# Patient Record
Sex: Female | Born: 1960 | Race: White | Hispanic: No | Marital: Married | Smoking: Never smoker
Health system: Southern US, Community
[De-identification: ages and names within clinical notes are randomized; demographics above are authoritative.]

## PROBLEM LIST (undated history)

## (undated) DIAGNOSIS — T8859XA Other complications of anesthesia, initial encounter: Secondary | ICD-10-CM

## (undated) DIAGNOSIS — Z8489 Family history of other specified conditions: Secondary | ICD-10-CM

## (undated) DIAGNOSIS — R0600 Dyspnea, unspecified: Secondary | ICD-10-CM

## (undated) DIAGNOSIS — R011 Cardiac murmur, unspecified: Secondary | ICD-10-CM

## (undated) DIAGNOSIS — G43909 Migraine, unspecified, not intractable, without status migrainosus: Secondary | ICD-10-CM

## (undated) DIAGNOSIS — I422 Other hypertrophic cardiomyopathy: Secondary | ICD-10-CM

## (undated) DIAGNOSIS — E785 Hyperlipidemia, unspecified: Secondary | ICD-10-CM

## (undated) DIAGNOSIS — F419 Anxiety disorder, unspecified: Secondary | ICD-10-CM

## (undated) DIAGNOSIS — Z95810 Presence of automatic (implantable) cardiac defibrillator: Secondary | ICD-10-CM

## (undated) DIAGNOSIS — M1712 Unilateral primary osteoarthritis, left knee: Secondary | ICD-10-CM

## (undated) DIAGNOSIS — I4729 Other ventricular tachycardia: Secondary | ICD-10-CM

## (undated) DIAGNOSIS — I499 Cardiac arrhythmia, unspecified: Secondary | ICD-10-CM

## (undated) DIAGNOSIS — Z8719 Personal history of other diseases of the digestive system: Secondary | ICD-10-CM

## (undated) DIAGNOSIS — T4145XA Adverse effect of unspecified anesthetic, initial encounter: Secondary | ICD-10-CM

## (undated) DIAGNOSIS — I421 Obstructive hypertrophic cardiomyopathy: Secondary | ICD-10-CM

## (undated) DIAGNOSIS — K219 Gastro-esophageal reflux disease without esophagitis: Secondary | ICD-10-CM

## (undated) DIAGNOSIS — M1711 Unilateral primary osteoarthritis, right knee: Secondary | ICD-10-CM

## (undated) DIAGNOSIS — I330 Acute and subacute infective endocarditis: Secondary | ICD-10-CM

## (undated) DIAGNOSIS — Z950 Presence of cardiac pacemaker: Secondary | ICD-10-CM

## (undated) DIAGNOSIS — R9431 Abnormal electrocardiogram [ECG] [EKG]: Secondary | ICD-10-CM

## (undated) DIAGNOSIS — E119 Type 2 diabetes mellitus without complications: Secondary | ICD-10-CM

## (undated) DIAGNOSIS — I4891 Unspecified atrial fibrillation: Secondary | ICD-10-CM

## (undated) DIAGNOSIS — Z8614 Personal history of Methicillin resistant Staphylococcus aureus infection: Secondary | ICD-10-CM

## (undated) DIAGNOSIS — Z87442 Personal history of urinary calculi: Secondary | ICD-10-CM

## (undated) DIAGNOSIS — K5730 Diverticulosis of large intestine without perforation or abscess without bleeding: Secondary | ICD-10-CM

## (undated) DIAGNOSIS — I472 Ventricular tachycardia: Secondary | ICD-10-CM

## (undated) HISTORY — DX: Unilateral primary osteoarthritis, left knee: M17.12

## (undated) HISTORY — DX: Unspecified atrial fibrillation: I48.91

## (undated) HISTORY — PX: MITRAL VALVE REPAIR: SHX2039

## (undated) HISTORY — DX: Ventricular tachycardia: I47.2

## (undated) HISTORY — DX: Hyperlipidemia, unspecified: E78.5

## (undated) HISTORY — DX: Migraine, unspecified, not intractable, without status migrainosus: G43.909

## (undated) HISTORY — DX: Cardiac murmur, unspecified: R01.1

## (undated) HISTORY — PX: INSERT / REPLACE / REMOVE PACEMAKER: SUR710

## (undated) HISTORY — PX: BI-VENTRICULAR IMPLANTABLE CARDIOVERTER DEFIBRILLATOR  (CRT-D): SHX5747

## (undated) HISTORY — DX: Acute and subacute infective endocarditis: I33.0

## (undated) HISTORY — DX: Abnormal electrocardiogram (ECG) (EKG): R94.31

## (undated) HISTORY — DX: Personal history of Methicillin resistant Staphylococcus aureus infection: Z86.14

## (undated) HISTORY — DX: Diverticulosis of large intestine without perforation or abscess without bleeding: K57.30

## (undated) HISTORY — DX: Obstructive hypertrophic cardiomyopathy: I42.1

## (undated) HISTORY — DX: Other ventricular tachycardia: I47.29

---

## 1989-08-29 HISTORY — PX: SHOULDER CAPSULORRHAPHY: SUR188

## 1998-05-26 ENCOUNTER — Ambulatory Visit (HOSPITAL_COMMUNITY): Admission: RE | Admit: 1998-05-26 | Discharge: 1998-05-26 | Payer: Self-pay | Admitting: Surgery

## 2001-05-31 ENCOUNTER — Ambulatory Visit (HOSPITAL_COMMUNITY): Admission: RE | Admit: 2001-05-31 | Discharge: 2001-05-31 | Payer: Self-pay | Admitting: Obstetrics and Gynecology

## 2001-05-31 ENCOUNTER — Encounter: Payer: Self-pay | Admitting: Obstetrics and Gynecology

## 2002-06-05 ENCOUNTER — Ambulatory Visit (HOSPITAL_COMMUNITY): Admission: RE | Admit: 2002-06-05 | Discharge: 2002-06-05 | Payer: Self-pay | Admitting: Obstetrics and Gynecology

## 2002-06-05 ENCOUNTER — Encounter: Payer: Self-pay | Admitting: Obstetrics and Gynecology

## 2003-06-09 ENCOUNTER — Ambulatory Visit (HOSPITAL_COMMUNITY): Admission: RE | Admit: 2003-06-09 | Discharge: 2003-06-09 | Payer: Self-pay | Admitting: Obstetrics and Gynecology

## 2003-06-09 ENCOUNTER — Encounter: Payer: Self-pay | Admitting: Obstetrics and Gynecology

## 2004-06-28 ENCOUNTER — Ambulatory Visit (HOSPITAL_COMMUNITY): Admission: RE | Admit: 2004-06-28 | Discharge: 2004-06-28 | Payer: Self-pay | Admitting: Obstetrics and Gynecology

## 2005-09-23 ENCOUNTER — Ambulatory Visit (HOSPITAL_COMMUNITY): Admission: RE | Admit: 2005-09-23 | Discharge: 2005-09-23 | Payer: Self-pay | Admitting: Obstetrics and Gynecology

## 2005-12-28 ENCOUNTER — Encounter: Payer: Self-pay | Admitting: Surgery

## 2007-06-29 ENCOUNTER — Ambulatory Visit (HOSPITAL_COMMUNITY): Admission: RE | Admit: 2007-06-29 | Discharge: 2007-06-29 | Payer: Self-pay | Admitting: Obstetrics and Gynecology

## 2007-08-30 DIAGNOSIS — K5730 Diverticulosis of large intestine without perforation or abscess without bleeding: Secondary | ICD-10-CM

## 2007-08-30 DIAGNOSIS — Z8614 Personal history of Methicillin resistant Staphylococcus aureus infection: Secondary | ICD-10-CM

## 2007-08-30 HISTORY — DX: Personal history of Methicillin resistant Staphylococcus aureus infection: Z86.14

## 2007-08-30 HISTORY — PX: COLON SURGERY: SHX602

## 2007-08-30 HISTORY — DX: Diverticulosis of large intestine without perforation or abscess without bleeding: K57.30

## 2007-11-10 ENCOUNTER — Emergency Department (HOSPITAL_COMMUNITY): Admission: EM | Admit: 2007-11-10 | Discharge: 2007-11-10 | Payer: Self-pay | Admitting: Emergency Medicine

## 2007-11-12 ENCOUNTER — Inpatient Hospital Stay (HOSPITAL_COMMUNITY): Admission: EM | Admit: 2007-11-12 | Discharge: 2007-11-16 | Payer: Self-pay | Admitting: Emergency Medicine

## 2007-11-21 ENCOUNTER — Ambulatory Visit (HOSPITAL_COMMUNITY): Admission: RE | Admit: 2007-11-21 | Discharge: 2007-11-21 | Payer: Self-pay | Admitting: General Surgery

## 2007-11-28 ENCOUNTER — Ambulatory Visit (HOSPITAL_COMMUNITY): Admission: RE | Admit: 2007-11-28 | Discharge: 2007-11-28 | Payer: Self-pay | Admitting: General Surgery

## 2008-02-05 ENCOUNTER — Ambulatory Visit (HOSPITAL_COMMUNITY): Admission: RE | Admit: 2008-02-05 | Discharge: 2008-02-05 | Payer: Self-pay | Admitting: General Surgery

## 2008-04-02 ENCOUNTER — Inpatient Hospital Stay (HOSPITAL_COMMUNITY): Admission: RE | Admit: 2008-04-02 | Discharge: 2008-04-06 | Payer: Self-pay | Admitting: General Surgery

## 2008-04-02 ENCOUNTER — Encounter (INDEPENDENT_AMBULATORY_CARE_PROVIDER_SITE_OTHER): Payer: Self-pay | Admitting: General Surgery

## 2008-05-07 ENCOUNTER — Ambulatory Visit (HOSPITAL_COMMUNITY): Admission: RE | Admit: 2008-05-07 | Discharge: 2008-05-07 | Payer: Self-pay | Admitting: Family Medicine

## 2008-06-16 ENCOUNTER — Encounter (HOSPITAL_BASED_OUTPATIENT_CLINIC_OR_DEPARTMENT_OTHER): Admission: RE | Admit: 2008-06-16 | Discharge: 2008-08-19 | Payer: Self-pay | Admitting: Internal Medicine

## 2008-07-09 ENCOUNTER — Ambulatory Visit (HOSPITAL_COMMUNITY): Admission: RE | Admit: 2008-07-09 | Discharge: 2008-07-09 | Payer: Self-pay | Admitting: Obstetrics and Gynecology

## 2008-10-30 ENCOUNTER — Ambulatory Visit (HOSPITAL_COMMUNITY): Admission: RE | Admit: 2008-10-30 | Discharge: 2008-10-30 | Payer: Self-pay | Admitting: Family Medicine

## 2009-03-09 IMAGING — CT CT PELVIS W/ CM
1 of 3 series · 14 of 32 positions shown, 19 images · IV contrast (agent unspecified)
Comparison: 11/12/2007

CT ABDOMEN

CLINICAL DATA: Abdominal pain.  History of diverticulitis.  Prior
surgery 04/02/2008.

CT ABDOMEN AND PELVIS WITH CONTRAST
TECHNIQUE: Multidetector CT imaging of the abdomen and pelvis was
performed using the standard protocol following bolus
administration of intravenous contrast.
Contrast: 100 ml [id number]

[Series 2: abd_pel 5.0 b40f · axial · 0.64mm/px · z∈[-437,-47]mm · 14 of 90 slices shown, 19 images]
[im 6/90  soft-tissue]
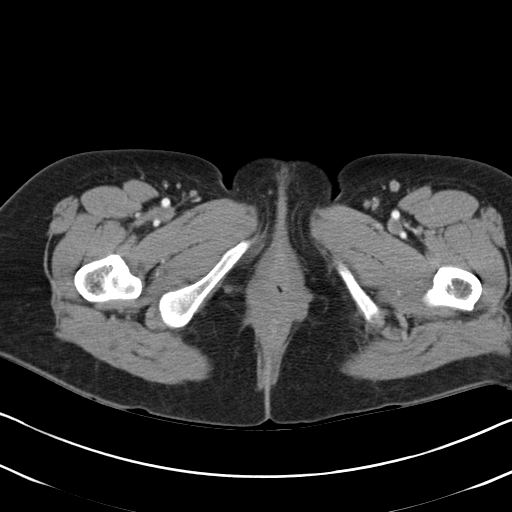
[im 6/90  bone]
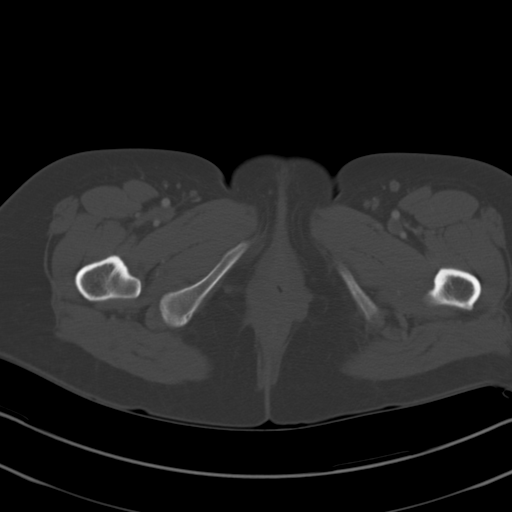
[im 11/90  soft-tissue]
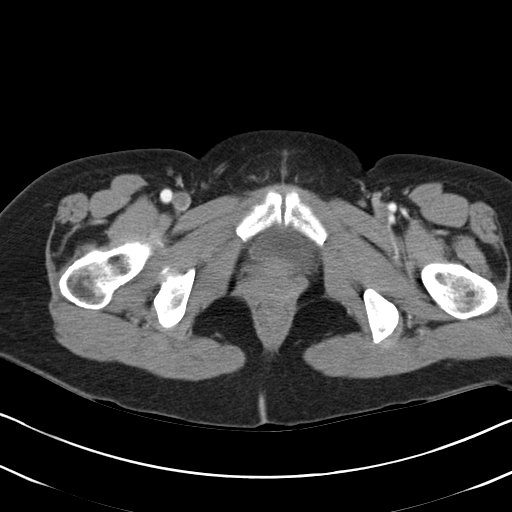
[im 21/90  soft-tissue]
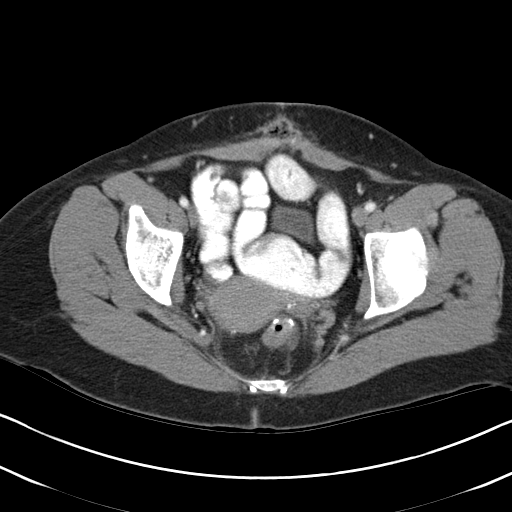
[im 27/90  soft-tissue]
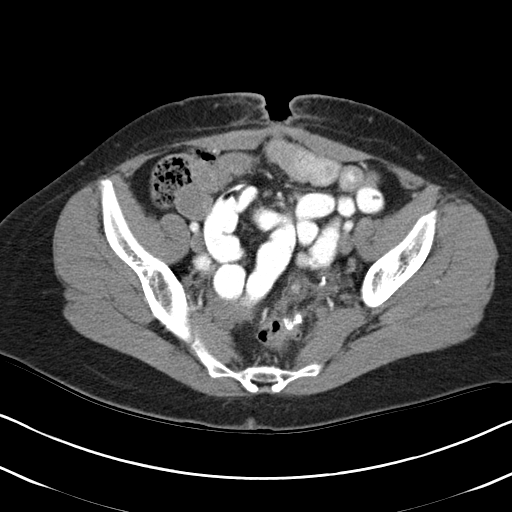
[im 32/90  soft-tissue]
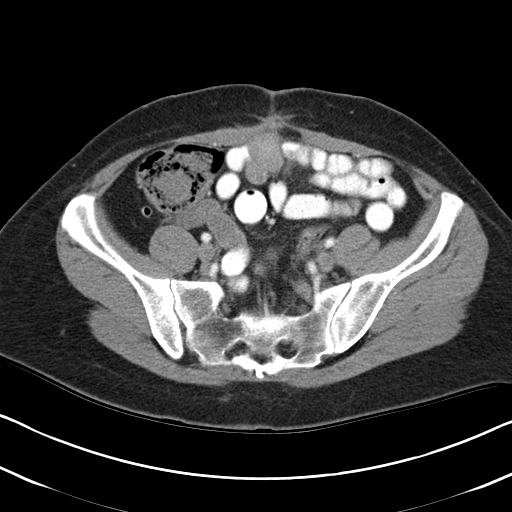
[im 37/90  soft-tissue]
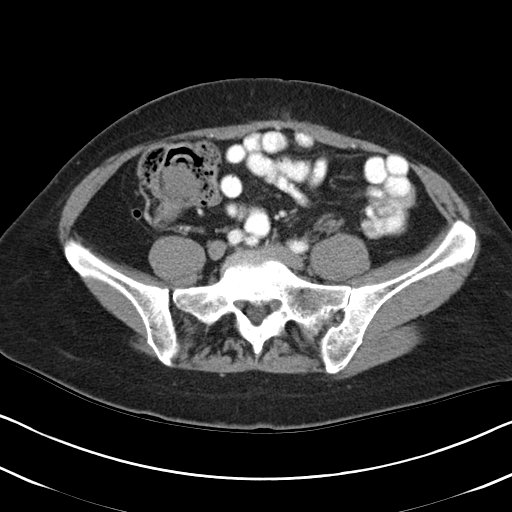
[im 48/90  soft-tissue]
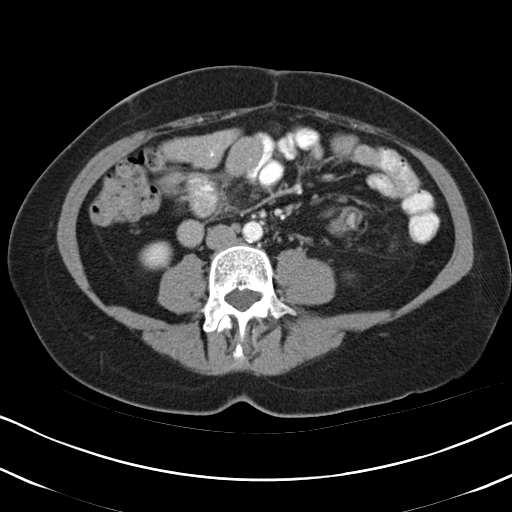
[im 53/90  soft-tissue]
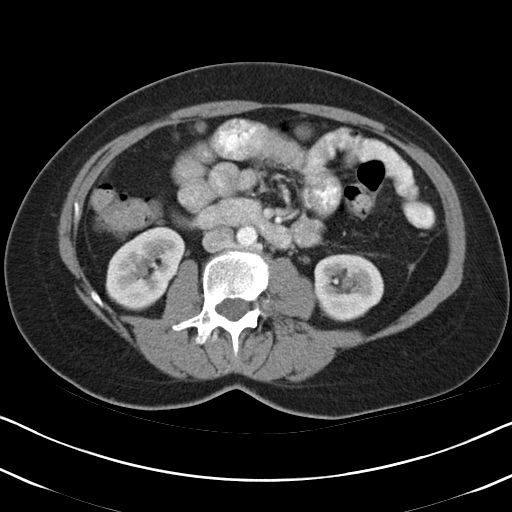
[im 58/90  soft-tissue]
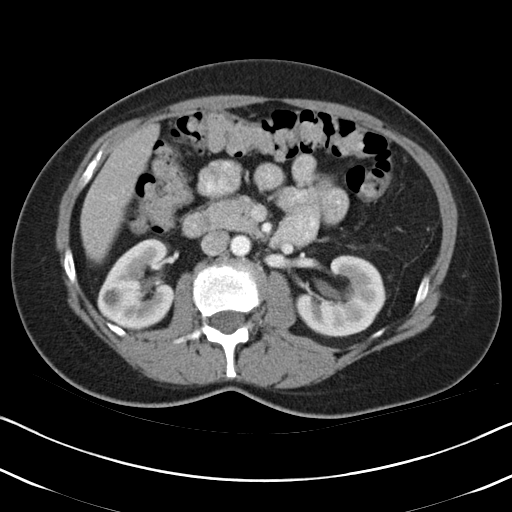
[im 58/90  bone]
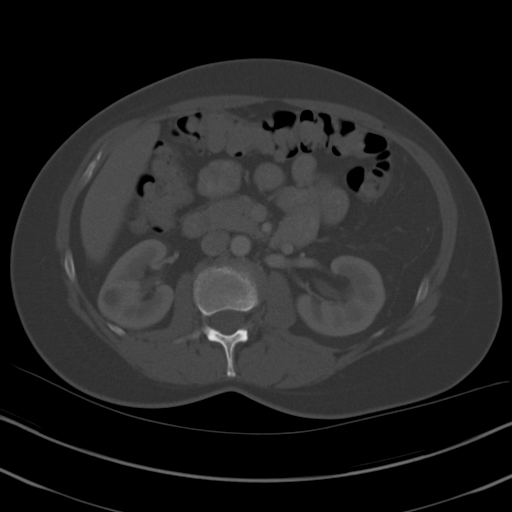
[im 63/90  soft-tissue]
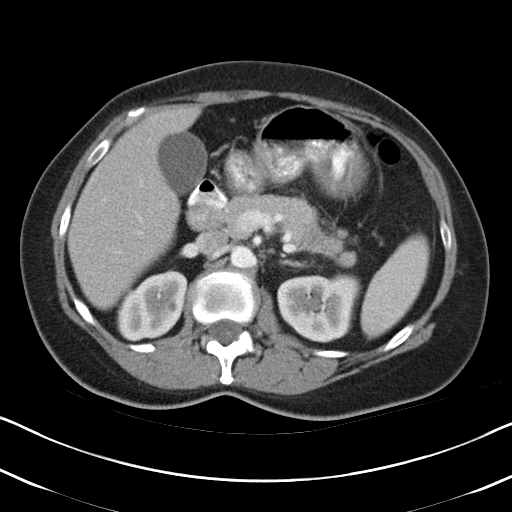
[im 69/90  soft-tissue]
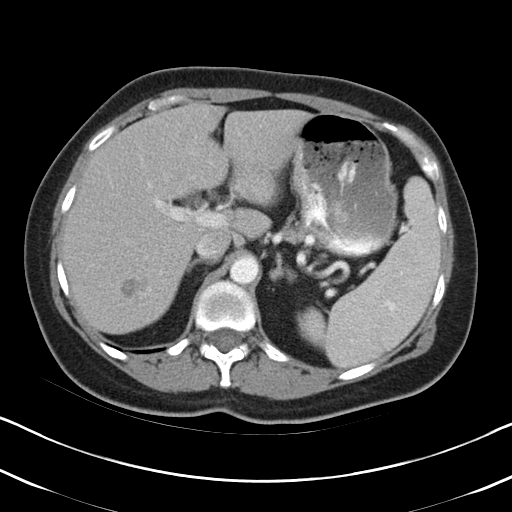
[im 69/90  lung]
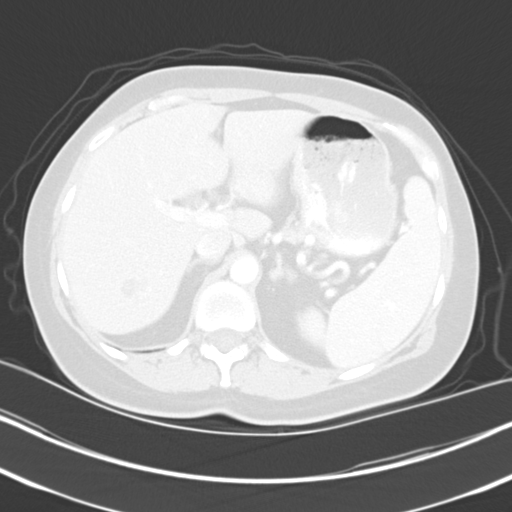
[im 74/90  lung]
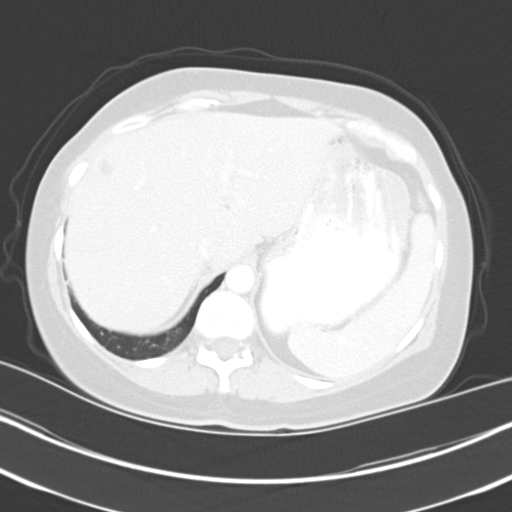
[im 79/90  soft-tissue]
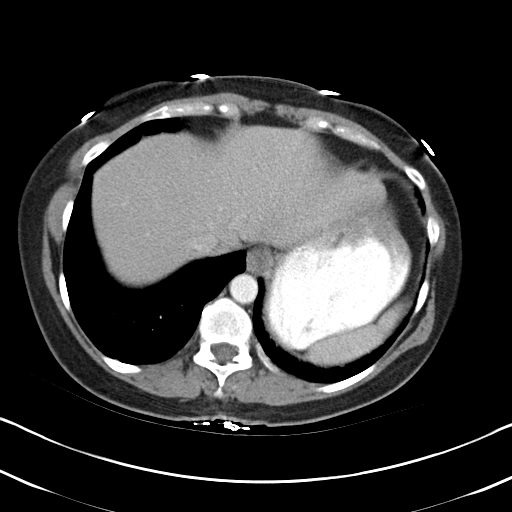
[im 79/90  lung]
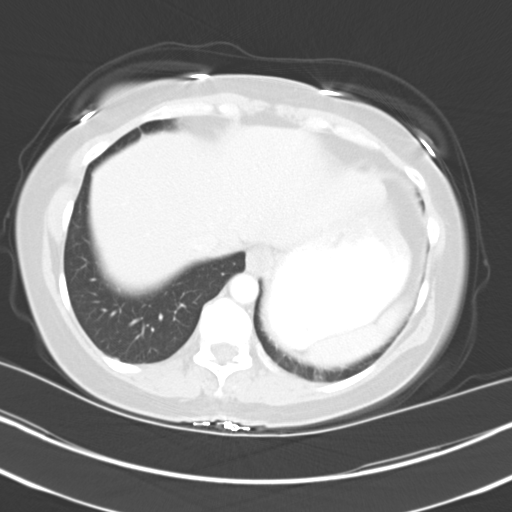
[im 84/90  soft-tissue]
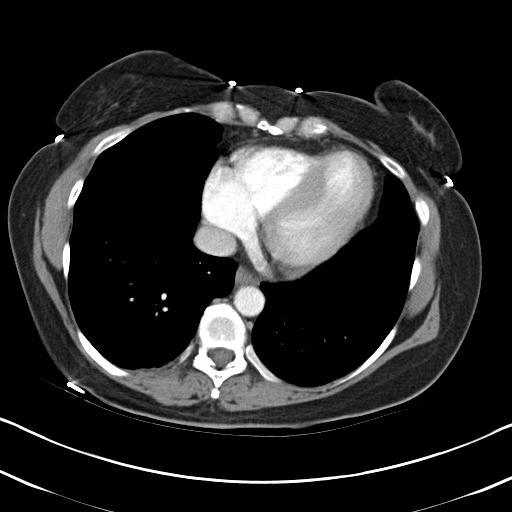
[im 84/90  lung]
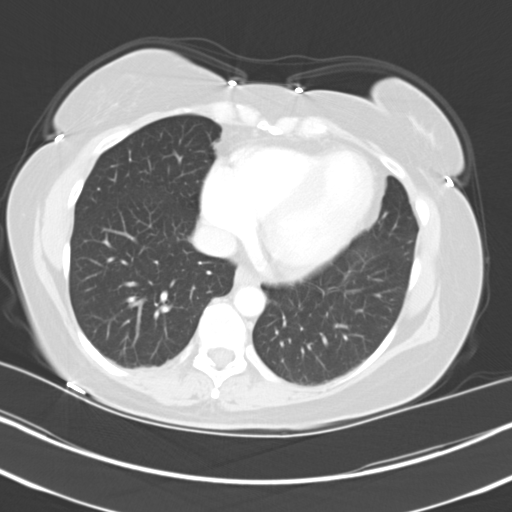

[14 of 32 positions shown; findings below may reference images not displayed]

FINDINGS: Small low-density areas throughout the liver are
unchanged, likely cysts.  There is a peripherally enhancing area
within the right lobe of the liver on image 22, most compatible
with hemangioma, stable.  Spleen, adrenals, kidneys unremarkable.
Small benign-appearing cyst in the mid pole of the right kidney.
Low density area in the tail of the spleen noted.  On the delayed
renal images, this appears to be fat invagination rather than a
focal lesion.  Gallbladder grossly unremarkable.

Bowel grossly unremarkable.  No free fluid, free air, or
adenopathy. Scattered descending colonic diverticula.  No evidence
of recurrent active diverticulitis.

Lung bases are clear.  No effusions.  Heart is normal size.
IMPRESSION: No acute findings in the abdomen.

CT PELVIS
FINDINGS: Postoperative changes noted in the rectosigmoid colon.
No fluid collections or free air.  Scattered few residual
diverticula noted.  Appendix is visualized and is normal.  Pelvic
small bowel grossly unremarkable.  No free fluid, free air, or
adenopathy.  Uterus and adnexa grossly unremarkable.
IMPRESSION: Postoperative changes in the rectosigmoid colon.  No acute
findings.

## 2011-01-11 NOTE — Consult Note (Signed)
NAME:  Heidi Webster, Heidi Webster NO.:  [id number]   MEDICAL RECORD NO.:  [id number]          PATIENT TYPE:  REC   LOCATION:  FOOT                         FACILITY:  [facility]   PHYSICIAN:  Lenon Curt. Chilton Si, M.D.  DATE OF BIRTH:  1960/10/21   DATE OF CONSULTATION:  DATE OF DISCHARGE:                                 CONSULTATION   REFERRED BY:  Carrington Clamp, MD, [facility], Gynecology  & Infertility.   PROBLEM:  Poor healing wound of the lower abdomen.   HISTORY:  A 50 year old reasonably healthy white female is [facility] for an another opinion in regards to a wound of the lower  abdomen that appears to be stealth.  The patient had an attack of  diverticulitis in March 2009, with perforation.  A drain was put in by  Interventional Radiology.  Ultimately, she went on to have surgery with  the sigmoid colectomy being done on April 02, 2008, by Dr. Bertram Savin,  of [facility].  There were wound infections following  this and she was left with a chronic draining tract of the lower abdomen  approximately 2-3 inches below the umbilicus.  There has been no fever  and no systemic signs of infection, the wound has been packed loosely  with saline impregnated gauze, however, it remains as deep as it was.   MRSA was found in the wound infection and a culture, but this was last  done in early September 2009, and it has not been repeated since then.   PAST MEDICAL HISTORY:  Recurrent ovarian cyst treated with Depo-Medrol  and she is otherwise healthy.  She is not diabetic.  There is a known  heart murmur which was felt to be benign.  She does not smoke.  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   MEDICATIONS:  Depo-Medrol shots.   ALLERGIES:  AUGMENTIN.   SURGERIES:  On April 02, 2008 sigmoid colectomy by Dr. Bertram Savin,  complicated by wound infection.   SOCIAL  HISTORY:  Does WiiFit, golfing, walking and works regularly.  Nonsmoker.  No alcohol use.  Married, with 2 children.  Occupation is  physical therapist.   FAMILY HISTORY:  Mother has muscular heart syndrome and father has a  history of coronary artery bypass as well as hypercholesterolemia.  Mother is diabetic at the onset of 25 years of age.  She has 2 maternal  uncle with diabetes.   REVIEW OF SYSTEMS:  Unremarkable.  No recent weight loss although, there  was some following the onset of her illness with the diverticulitis.  HEAD, EYES, EARS, NOSE AND THROAT:  No ocular discomfort, pain or visual  disturbance.  CHEST:  Denies shortness of breath, wheezing, and cough.  HEART:  History of murmur felt to be benign in origin.  No palpitations  or chest pains.  ABDOMEN:  Denies nausea, diarrhea, and constipation.  GENITOURINARY:  History of ovarian cyst.  No current active issues  there.  MUSCULOSKELETAL:  No deformities.  History of fracture.  NEUROLOGIC:  Cranial nerves without problems.  No paresthesias.  No  history of chronic neurologic disorder.  Denies tremor.   PHYSICAL EXAMINATION:  VITAL SIGNS:  Temp 98.6, pulse 116, and blood  pressure 122/84.  GENERAL:  A well developed and well nourished middle aged female.  SKIN:  Unremarkable except for the open area, 3 inches below the  umbilicus.  HEAD, EYES, EARS, NOSE AND THROAT:  Conjunctivae is clear.  Sclera is  white.  Pupils equal, round and reactive to light and accommodation.  Extraocular movements full.  Hearing normal.  Oropharynx unremarkable.  NECK:  Supple.  No thyromegaly.  No mass or bruits.  CHEST:  Clear.  HEART:  Regular rhythm without gallop or audible murmur today.  ABDOMEN:  Nontender.  Active bowel sounds.  No organomegaly.  Open sore  of the midline in the lower abdomen.  EXTREMITIES:  Normal.  MUSCULOSKELETAL:  Without deformity.  NEUROLOGIC:  Normal.  Cranial nerves, no tremors.  No paresthesias.  No   sensory deficits.   PROBLEMS:  Nonhealing surgical wounds.   PLAN:  Wound was cultured.  We also packed loosely with iodoform gauze.  The patient return in 1 week for reevaluation of the wound.  There was  an area of epithelialization of the inferior lateral quadrant, which may  delay healing of this area somewhat.  We will need to take another look  at that in next visit.      Lenon Curt Chilton Si, M.D.  Electronically Signed     AGG/MEDQ  D:  06/20/2008  T:  06/21/2008  Job:  [id number]   cc:   Lennie Muckle, MD  Carrington Clamp, M.D.

## 2011-01-11 NOTE — Assessment & Plan Note (Signed)
[facility]   NAME:  Heidi Webster, Heidi Webster NO.:  [id number]   MEDICAL RECORD NO.:  [id number]      DATE OF BIRTH:  10/04/1960   PHYSICIAN:  Lenon Curt. Chilton Si, M.D.   VISIT DATE:  07/04/2008                                   OFFICE VISIT   HISTORY:  This 51 year old with nonhealing surgical wound of the lower  abdomen returns for recheck today.  She says the drainage from the wound  has changed in character.  It is now clear and small in volume.  The  wound itself is comfortable.  She denies any significant burning or  discomfort in this area.  There have been no fevers.   PHYSICAL EXAMINATION:  VITAL SIGNS:  Temperature 98.1, pulse 80,  respirations 20, and blood pressure 122/83.  Wound measurement is 0.8 x 0.4 x 0.6, unchanged from previous  measurements.  There is a sinus tract about 1.5 cm at 5 o'clock.  The  base of the wound shows healthy granulation tissue which is quite red  and beefy in color.  There is no significant drainage and no slough in  the base of the wound.   TREATMENT:  Packing was changed today to calcium alginate and a small  strip was applied on this loosely to the wound.  We want this done  weekly.  The patient is to return in 1 week for nurse change of the  calcium alginate and in 2 weeks for a physician inspection.   The patient was advised today that there is a cicatricial formation of  scar tissue around this wound and if it does not begin to fill in  cleanly from the base, that surgical referral for additional action on  this scar tissue may be necessary.   CPT CODE:  26834.   ICD-9 CODE:  990.83, nonhealing surgical wound.      Lenon Curt Chilton Si, M.D.  Electronically Signed     AGG/MEDQ  D:  07/04/2008  T:  07/05/2008  Job:  [id number]

## 2011-01-11 NOTE — H&P (Signed)
NAME:  Heidi Webster, Heidi Webster                 ACCOUNT NO.:  [id number]   MEDICAL RECORD NO.:  [id number]          PATIENT TYPE:  INP   LOCATION:  1827                         FACILITY:  [facility]   PHYSICIAN:  Wilson Singer, M.D.DATE OF BIRTH:  [birth year]   DATE OF ADMISSION:  11/12/2007  DATE OF DISCHARGE:                              HISTORY & PHYSICAL   HISTORY:  This is a very pleasant 50 year old lady who started to have  left lower abdominal pain, which started approximately 4 days ago.  She  went to the emergency room at [facility] and was told that she  has diverticulitis, as shown on CT scanning at that facility, and was  given oral antibiotics.  Unfortunately, she did not improve and her  abdomen became more swollen and she presented to her primary care  physician today, this morning and was sent to the emergency room here at  [facility].  She has been feeling feverish.  She is also having  rigors.  She is also having lightheadedness and dizziness.   PAST SURGICAL HISTORY:  Right shoulder surgery in the past.   PAST MEDICAL HISTORY:  No other serious illnesses.   SOCIAL HISTORY:  She has been married for 20 years.  She occasionally  drinks alcohol.  She does not smoke.  She is a pediatric  physiotherapist.   MEDICATIONS:  Include recent ciprofloxacin, Flagyl, and Percocet.  She  also takes Depo-Provera.   ALLERGIES:  She is currently allergic to AUGMENTIN with produces a rash,  but seems to tolerate other penicillin type of drugs.   REVIEW OF SYSTEMS:  Apart from the symptoms mentioned above, there are  no other symptoms relative.   PHYSICAL EXAMINATION:  VITAL SIGNS:  Temperature 99.5, blood pressure  117/74, pulse 130 and in sinus rhythm with respiratory rate 14, and  saturation 96%.  GENERAL:  She feels warm, but is not clinically toxic.  She is alert and  oriented.  CARDIOVASCULAR:  Heart sounds are present with a resting tachycardia.  There are no  murmurs.  RESPIRATORY:  Lungs fields are clear.  ABDOMEN:  Soft, but is pretty tender, generalized with some rebound  tenderness and I can feel a mass in the left lower quadrant consistent  with CT scan findings.  NEUROLOGIC:  She is alert and oriented with no focal neurological signs.   INVESTIGATIONS:  CT scan of the abdomen and pelvis done here at [facility] shows a 7 x 2.5 x 3.5 cm abscess in the descending colon consistent  with a diverticular abscess.   LABORATORY WORK:  Hemoglobin 13, white blood cell count 16.1, and  platelets 242.  Sodium 138, potassium 3.3, chloride 109, BUN 8, glucose  108, and creatinine 0.9.  Urinalysis essentially unremarkable.   IMPRESSION:  Descending colon, diverticular abscess.   PLAN:  1. Admit.  2. Intravenous antibiotics.  3. Surgical consultation.  I think this will certainly require      incision and drainage and possibly further surgery and will not      resolve with intravenous antibiotics alone.  Further recommendations will depend on the patient's hospital progress.      Wilson Singer, M.D.  Electronically Signed     NCG/MEDQ  D:  11/12/2007  T:  11/13/2007  Job:  [id number]

## 2011-01-11 NOTE — Assessment & Plan Note (Signed)
[facility]   NAME:  Heidi Webster, Heidi Webster NO.:  [id number]   MEDICAL RECORD NO.:  [id number]      DATE OF BIRTH:  Nov 03, 1960   PHYSICIAN:  Lenon Curt. Chilton Si, M.D.        VISIT DATE:                                   OFFICE VISIT   HISTORY:  A 50 year old female with nonhealing postsurgical wound of the  lower abdomen returns today for recheck.  Wound was packed with Iodoform  gauze when seen on her initial visit on June 20, 2008.  This last  week, she says that she had a mild burning in this area related to the  gauze packing for the first couple of days and then everything quieted  down.  On removal with gauze today, the wound measurements show some  improvement.  There is no surrounding erythema.  There is very little  drainage from the wound.   PHYSICAL EXAMINATION:  Temperature 98.4, pulse 100, respirations 18, and  blood pressure 115/81.  Wound measurements are 0.8 x 0.4 x 0.6.  There  is a small sinus tract of 1.5 cm at 5 o'clock.  This is previously  measured as 2 cm.   TREATMENT:  Repack the wound Iodoform gauze.  The patient will return in  1 week for repeat evaluation.  We will continue this procedure until we  reach full wound healing or no further progress is seen.      Lenon Curt Chilton Si, M.D.  Electronically Signed     AGG/MEDQ  D:  06/27/2008  T:  06/28/2008  Job:  [id number]

## 2011-01-11 NOTE — Assessment & Plan Note (Signed)
[facility]   NAME:  Heidi, Webster NO.:  [id number]   MEDICAL RECORD NO.:  [id number]      DATE OF BIRTH:  11/09/1960   PHYSICIAN:  Lenon Curt. Chilton Si, M.D.   VISIT DATE:  07/18/2008                                   OFFICE VISIT   HISTORY:  A 50 year old female with nonhealing surgical wound on the  lower abdomen returns for recheck.  The drainage of the wound is clear  to slightly bloody, small in volume, and the wound itself has no pain.  There have been no fevers.   EXAMINATION:  Temperature 98.8, pulse 96, respirations 20, blood  pressure 130/86.  Wound of the suprapubic area of the abdomen in a surgical scar now  measures 0.6 x 0.3 x 1 cm depth.  There is red granulation tissue at the  wound base.  There is no evidence of infection.  Overall, wound  appearance has improved.   TREATMENT:  Calcium alginate was repacked in the wound base and then it  was covered with a bandage.  The patient was given a strip of the  calcium alginate to replace next week.  She will return for physician  inspection of the wound in 2 weeks.   There continues to be a cicatricial formation of scar tissue around the  wound, but it is filling in from the bottom now.  The patient has an  appointment with her surgeon next week.   ICD-9 code [phone], nonhealing surgical wound.  CPT code [phone].      Lenon Curt Chilton Si, M.D.  Electronically Signed     AGG/MEDQ  D:  07/18/2008  T:  07/18/2008  Job:  [id number]

## 2011-01-11 NOTE — Assessment & Plan Note (Signed)
[facility]   NAME:  Heidi, Webster NO.:  [id number]   MEDICAL RECORD NO.:  [id number]      DATE OF BIRTH:  March 30, 1961   PHYSICIAN:  Lenon Curt. Chilton Si, M.D.   VISIT DATE:  08/01/2008                                   OFFICE VISIT   HISTORY:  This 50 year old female returns today for recheck of a  nonhealing surgical wound.  The base is still draining a small amount of  material.  She denies any pain.  There has been no fever.   PHYSICAL EXAMINATION:  VITAL SIGNS:  Temperature 97.9, pulse 80,  respirations 19, and blood pressure 128/80.  Wound measurements are now 0.4 x 0.3 x 0.6 cm.  Base of the wound has  beefy granulation material, small amount of serosanguineous drainage.  No debridement was necessary.   TREATMENT:  We repacked the wound loosely with calcium alginate strips.  The patient is to continue to do this on a weekly or more often basis as  needed.  She is to return in 2 weeks for reevaluation of the wound.   ICD-9 CODE:  998.83, nonhealing surgical wound.   CPT CODE:  81191.      Lenon Curt Chilton Si, M.D.  Electronically Signed     AGG/MEDQ  D:  08/01/2008  T:  08/01/2008  Job:  [id number]

## 2011-01-11 NOTE — Discharge Summary (Signed)
NAME:  Heidi Webster, Heidi Webster NO.:  [id number]   MEDICAL RECORD NO.:  [id number]          PATIENT TYPE:  INP   LOCATION:  1532                         FACILITY:  [facility]   PHYSICIAN:  Lennie Muckle, MD      DATE OF BIRTH:  [birth year]   DATE OF ADMISSION:  04/02/2008  DATE OF DISCHARGE:                               DISCHARGE SUMMARY   HOSPITAL COURSE:  [name] is a 50 year old female who underwent  laparoscopic hand assisted sigmoid resection on April 02, 2008.  Postoperatively she has had a rather uncomplicated hospital course.  She  had some mild nausea postoperatively but has seemed to have resolved  with having flatus today.  Her incisions have been healing without  problems.  There is a mild area of erythema at the midline lower  incision that seems to be mostly resolving ecchymosis.  She has been  started on a regular diet.  I am changing her over to oral narcotics  today and, if she does well with this, will be able to go home later  today on postoperative day #4.  She will follow up with me in  approximately 2 weeks and call the office for fevers, chills, increasing  erythema at her incision sites.   FINAL DIAGNOSIS:  Status post laparoscopic hand assisted sigmoid  resection for diverticulitis.   CONDITION:  Improved on discharge.      Lennie Muckle, MD  Electronically Signed     ALA/MEDQ  D:  04/06/2008  T:  04/06/2008  Job:  [id number]   cc:   Patrica Duel, M.D.  Fax: [phone]

## 2011-01-11 NOTE — Assessment & Plan Note (Signed)
[facility]   NAME:  SEHAJ, KOLDEN NO.:  [id number]   MEDICAL RECORD NO.:  [id number]      DATE OF BIRTH:  11-01-1960   PHYSICIAN:  Lenon Curt. Chilton Si, M.D.        VISIT DATE:                                   OFFICE VISIT   HISTORY:  A 50 year old female with nonhealing wound of the abdomen  following surgery, returns for reinspection of the wound today.  She  says it closed up yesterday.  There is no drainage now and no  discomfort.  She is pleased with the outcome.   PHYSICAL EXAMINATION:  Temperature 98.1, pulse 92, respirations is 24,  blood pressure 138/88.  There is complete resolution of the suprapubic  wound of the abdomen.  Dense scar tissue was present in this area.  There is no sinus tract present at this time.   TREATMENT:  Wound is fully healed.   ICD-9 code 998.83, nonhealing surgical wound.   CPT code 16109      Lenon Curt. Chilton Si, M.D.  Electronically Signed     AGG/MEDQ  D:  08/15/2008  T:  08/16/2008  Job:  [id number]   cc:   Carrington Clamp, M.D.

## 2011-01-11 NOTE — Discharge Summary (Signed)
[name], [name] NO.:  [id number]   MEDICAL RECORD NO.:  [id number]          PATIENT TYPE:  INP   LOCATION:  3031                         FACILITY:  [facility]   PHYSICIAN:  Lonia Blood, M.D.       DATE OF BIRTH:  24-Jan-1961   DATE OF ADMISSION:  11/12/2007  DATE OF DISCHARGE:  11/16/2007                               DISCHARGE SUMMARY   PRIMARY CARE PHYSICIAN:  Patrica Duel, M.D.   DISCHARGE DIAGNOSES:  1. Sigmoid acute diverticulitis with intraabdominal abscess, status      post percutaneous drain.  2. Sinus tachycardia.   DISCHARGE MEDICATIONS:  1. Ciprofloxacin 500 mg by mouth twice a day for 1 week.  2. Flagyl 500 mg by mouth 3 times a day for 1 week.   CONDITION ON DISCHARGE:  Heidi Webster is discharged in good condition.  At  the time of the discharge, she is without any symptoms, afebrile with  stable vital signs.  Heidi Webster is instructed to empty and record the  output from the drain and once the output is less than 10 mL per 24-hour  period she needs to call [facility] and speak with Dr.  Eliot Ford nurse to have outpatient CT scan of abdomen and pelvis set up.  The phone number to call to set up CT scan of abdomen and pelvis is [phone].  The patient was instructed to report back to the emergency room  if she has fever, chills, increased abdominal pain, or she has got  increased output through the drain.   PROCEDURES DURING THIS ADMISSION:  1. On November 12, 2007, the patient had a CT scan of abdomen and pelvis      with findings of a large abdominal abscess next to the sigmoid      colon.  2. On November 13, 2007, the patient underwent a CT-guided drainage of a      large perisigmoid abscess by Interventional Radiology, Dr. Bonnielee Haff.   CONSULTATIONS DURING THIS ADMISSION:  The patient was seen in  consultation by Dr. Bertram Savin from [facility] and by Dr.  Bonnielee Haff from Interventional Radiology.   HISTORY AND PHYSICAL:   Refer the dictated H&P done by Dr. Lilly Cove  on November 12, 2007.   HOSPITAL COURSE:  1. Acute sigmoid diverticulitis with pelvic abscess abutting the      sigmoid colon.  Heidi Webster was admitted to Acute Care Unit of [facility]  She was placed on intravenous fluid and intravenous      Zosyn.  She had an emergent percutaneous drain of her abdominal      abscess done under CT guidance by Interventional Radiology.      Immediately, the patient felt much better and her pain decreased      significantly.  The patient's leukocytosis improved on a daily      basis and by the time of the discharge, the white blood cell count      is 6.1  We have converted Heidi Webster to  oral antibiotics from November 15, 2007, without recurrence of symptoms, pyrexia or rebound      leukocytosis.  The plan is to follow the drain from the abscess and      when it is below 10 mL per 24 hours to repeat the CT scan of the      abdomen and then remove the drain.  Obviously, the patient is to be      considered for elective sigmoid colectomy after this event.  2. Sinus tachycardia.  We felt that this is probably constitutional,      and it was worsened now by the abdominal infection.  Heidi Webster had      a workup, which did not indicate presence of hyperthyroidism or      pulmonary emboli.  She also be ruled out for myocardial infarction.      Lonia Blood, M.D.  Electronically Signed     SL/MEDQ  D:  11/16/2007  T:  11/17/2007  Job:  [id number]   cc:   Patrica Duel, M.D.  Lennie Muckle, MD  Art A. Hoss, M.D.

## 2011-01-11 NOTE — Assessment & Plan Note (Signed)
[facility]   NAME:  Heidi Webster, Heidi Webster NO.:  [id number]   MEDICAL RECORD NO.:  [id number]      DATE OF BIRTH:  December 10, 1960   PHYSICIAN:  Lenon Curt. Chilton Si, M.D.   VISIT DATE:  07/18/2008                                   OFFICE VISIT   HISTORY:  A 50 year old female with nonhealing surgical wound on the  lower abdomen returns for recheck.  The drainage of the wound is clear  to slightly bloody, small in volume, and the wound itself has no pain.  There have been no fevers.   EXAMINATION:  Temperature 98.8, pulse 96, respirations 20, blood  pressure 130/86.  Wound of the suprapubic area of the abdomen in a surgical scar now  measures 0.6 x 0.3 x 1 cm depth.  There is red granulation tissue at the  wound base.  There is no evidence of infection.  Overall, wound  appearance has improved.   TREATMENT:  Calcium alginate was repacked in the wound base and then it  was covered with a bandage.  The patient was given a strip of the  calcium alginate to replace next week.  She will return for physician  inspection of the wound in 2 weeks.   There continues to be a cicatricial formation of scar tissue around the  wound, but it is filling in from the bottom now.  The patient has an  appointment with her surgeon next week.   ICD-9 code [id number], nonhealing surgical wound.  CPT code [phone].      Lenon Curt Chilton Si, M.D.     AGG/MEDQ  D:  07/18/2008  T:  07/18/2008  Job:  [id number]

## 2011-01-11 NOTE — Op Note (Signed)
NAME:  BRYNJA, MARKER NO.:  [id number]   MEDICAL RECORD NO.:  [id number]          PATIENT TYPE:  INP   LOCATION:  0001                         FACILITY:  [facility]   PHYSICIAN:  Lennie Muckle, MD      DATE OF BIRTH:  10-10-1960   DATE OF PROCEDURE:  04/02/2008  DATE OF DISCHARGE:                               OPERATIVE REPORT   PREOPERATIVE DIAGNOSIS:  Diverticulitis with abscess.   POSTOPERATIVE DIAGNOSIS:  Diverticulitis with abscess.   PROCEDURE:  1. Laparoscopic had assisted sigmoid resection.  2. Take down of splenic flexure.   SURGEON:  Lennie Muckle, MD   ASSISTANT:  Juanetta Gosling, MD   ANESTHESIA:  General endotracheal anesthesia.   SPECIMEN:  Sigmoid colon.   FINDINGS:  Inflammatory reaction in sigmoid colon with adhesions to her  lateral sidewall.   ESTIMATED BLOOD LOSS:  Approximately 100 mL.   COMPLICATIONS:  No immediate complications.   DRAINS:  No drains were placed.   INDICATIONS FOR PROCEDURE:  Ms. Bhullar is a 50 year old female who was  seen at [facility] due to perforated diverticulitis.  She had an abscess  measuring 7 x 2 x 3 and had it successfully drained with a pigtail by  radiology.  She was discharged home on antibiotics and was able to have  her drain discontinued several weeks later.  She had resolution of her  abdominal pain and no fevers or chills.  I had discussed with her  preoperatively potentially treating her with conservative management  since it was her first bout of diverticulitis.  However, after  discussion she was in such pain from her previous bout of diverticulitis  and was concerned of a recurrence that she wanted to go head and have  resection of her sigmoid.  Informed consent was obtained prior to the  procedure.   DESCRIPTION OF PROCEDURE:  Ms. Norfolk was identified in the preoperative  holding area.  She had received Entereg and IV cefoxitin prior to the  procedure.  She did receive heparin in the  holding area.  She was then  taken to the operating room, placed in the supine position.  After  administration of general endotracheal anesthesia she was placed in the  lithotomy position.  A Foley catheter was placed.  Her rectum was  irrigated by me with saline with small amount of iodoform.  Her abdomen  and perineal area were prepped and draped in the usual sterile fashion.  A time-out proceeded indicating the patient and procedure were  performed.  I placed an incision at the umbilicus with a #11 blade and  placed the Veress needle into the abdominal cavity.  After adequate  insufflation I placed a 5 mm trocar using OptiView into the abdominal  cavity.  All wall layers were identified.  There was no evidence of  injury upon placement of the trocar or the needle.  I then placed a 5 mm  trocar in the right side of the abdomen.  I placed an 11 mm trocar  suprapubically.  A 5 mm trocar was placed  in the left upper quadrant.  The area of concern was identified in the left colon.  Inflammatory  reaction adhered the colon to the abdominal wall.  I began dissecting  laterally along the line of Toldt.  I proceeded distally down to the  inflammatory reaction.  I used sharp dissection to dissect along the  lateral wall from the adhesions.  I then continued inferiorly down  toward the pelvis.  Using the blunt dissection as well as the LigaSure  device I was able to remove the colon from the attachments to the  lateral sidewall.  We then proceeded proximally up towards the splenic  flexure.  I then chose to score the mesentery taking an area proximal to  the inflammatory reaction in the colon.  Using a LigaSure device I  divided the mesentery and enlarged distally down to the pelvis.  We were  able to identify the ureter along the pelvic sidewall and keep this in  the operative view as we divided the mesentery.  Continued further  dissecting the adhesion to the lateral sidewall.  There were  extensive  adhesions in the area of the perforation.  However, we were able to keep  the ureter in view and keep this a safe distance with the dissection.  I  then proceeded distally down to the pelvis.   There were multiple diverticula present.  I chose an area distal to the  inflammatory reaction and diverticula which seemed free of disease.  Using the laparoscopic GIA stapler with autosuture I deployed a 6 mm  device across the distal sigmoid.  During the firing of the stapler it  was noted that the staple line did not deploy.  The mucosa was seen at  the transection line.  I then continued dissecting the mesentery down on  the distal sigmoid proximal rectum with the LigaSure device to gain  distally from the staple line.  I was able to gain enough length to  place a new stapler load across the distal sigmoid proximal rectum with  the Ethicon 45 stapler.  I used three loads to transect across the  distal colon.  I then proceeded proximally and found an area free from  disease which appeared to have good blood flow.  I deployed the  laparoscopic stapler across the proximal colon.  I then attempted to  bring the staple line down into the pelvis.  It seemed somewhat  resistant, therefore, we performed a full mobilization of the splenic  flexure and part of the omental attachments to the colon.  It seemed  somewhat more relaxed coming to the pelvis.  I did choose to divide some  more of the mesentery so that its length would not be under tension.  At  that time I removed the stapled specimen after placing a HandPort  through the suprapubic incision.  This allowed me to palpate the distal  segment ensuring this was free of disease as well palpating the proximal  segment. At that time using stapling sizers I was able to fit  approximately 33 in the proximal colon after removing the staple line.  The anvil was secured to the proximal segment using a pursestring 2.0  prolene suture.  Dr.  Dwain Sarna then went beneath the patient and placed  an EEA stapler up through the anus.  We attempted to gain some length to  the distal portion of the resection.  I then chose to perform a side-end  anastomosis using the EEA stapler with  good deployment and two staple  lines were noted after removal of the stapling device.  The abdomen was  then irrigated.  The staple line appeared intact.  There was no evidence  of bleeding upon full inspection of the abdomen.  I then closed the  HandPort using a #1 PDS suture in a running fashion.  The wound was  irrigated.  I closed the defect with a 3-0 Vicryl and 4-0 Monocryl.  Final inspection revealed no bleeding.  Pneumoperitoneum was released.  I closed the port sites with a 4-0 Monocryl suture.  The patient was  extubated, transferred to post anesthesia care unit in stable condition.      Lennie Muckle, MD  Electronically Signed     ALA/MEDQ  D:  04/02/2008  T:  04/02/2008  Job:  [id number]   cc:   Patrica Duel, M.D.  Fax: [phone]

## 2011-01-11 NOTE — Consult Note (Signed)
NAME:  Heidi Webster, YOUNT NO.:  [id number]   MEDICAL RECORD NO.:  [id number]          PATIENT TYPE:  INP   LOCATION:  3031                         FACILITY:  [facility]   PHYSICIAN:  Lennie Muckle, M.D.   DATE OF BIRTH:  [birth year]   DATE OF CONSULTATION:  11/13/2007  DATE OF DISCHARGE:                                 CONSULTATION   PRIMARY CARE PHYSICIAN:  Dr. Patrica Duel.   REASON FOR CONSULTATION:  Sigmoid diverticulitis with pelvic abscess.   HISTORY OF PRESENT ILLNESS:  This is a 50 year old white female who has  no significant past medical history, who began having abdominal pain on  Saturday during the day, which she thought was probably an ovarian cyst,  because she has had many of these episodes in the past; however, later  that day the pain began to intensify and increase and she began to feel  worse, with an increase in abdominal pain as well as chills, and  therefore she went to [facility] Emergency Department that  night.  A CT scan was done which showed a sigmoid diverticulitis;  however, there was no abscess at the time.  Her white blood cell count  at this time was also normal.  At that time she was given prescription  for Cipro and Flagyl, and the patient was discharged from the emergency  department.  On Sunday, the patient continued to worsen and by Monday  the patient was complaining of chills, nausea and vomiting.  At this  time she went to see her primary care physician, who then after his  examination, sent her to the [facility] Emergency  Department.   Once arriving here, the patient had a repeat CT scan which showed  continued sigmoid diverticulitis; however, a 7 cm x 2.5 cm x 3.5 cm  abscess was found this time.  Her white blood cell count on admission  was 16,100.  The patient was started on IV Zosyn and her white blood  cell count had decreased to 12,100 by today on November 13, 2007.  At this  time due to the  patient's diverticulitis with pelvic abscess, a surgical  consultation was recommended.   REVIEW OF SYSTEMS:  See the HPI.  Otherwise all other systems are  negative.   FAMILY HISTORY:  Noncontributory.   PAST MEDICAL HISTORY:  Significant for ovarian cysts.   PAST SURGICAL HISTORY:  The patient had a shoulder arthroscopy but I am  unaware of the date.   SOCIAL HISTORY:  The patient is married and has two children.  One is  age 40, one is age 62.  One is a boy and one is a girl.  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  She lives in [place].   ALLERGIES:  AUGMENTIN causes itching.   MEDICATIONS AT HOME:  1. Depo-Provera shot given every three months.  2. Multivitamin daily.   ADMISSION MEDICATIONS:  1. Since being admitted, the patient has been placed on IV Zosyn.  2. Lovenox.  3. Various other p.r.n. medications  for nausea and pain.   PHYSICAL EXAMINATION:  GENERAL:  This is a pleasant well-developed and  well-nourished 50 year old white female who is laying in bed, in no  acute distress.  VITAL SIGNS:  Temperature 98 degrees, pulse 93, respirations 22, blood  pressure 114/78.  HEENT:  Eyes:  Sclerae not injected.  Pupils equal, round, reactive to  light.  Ears:  Without any obvious lesions or masses.  Nose:  No  rhinorrhea.  Mouth:  Pink and moist.  Throat:  Shows no exudates or  erythema.  NECK:  Supple.  No thyromegaly.  Trachea is midline.  LUNGS:  Clear to auscultation bilaterally with no wheezes, rhonchi or  rales noted.  Respiratory effort is normal.  HEART:  A regular rate and rhythm.  Normal S1 and S2.  No murmurs,  gallops or rubs are noted.  PULSES:  The patient does have +2 bilateral carotid and pedal pulses.  CHEST:  Symmetrical.  ABDOMEN:  Soft, with exquisite tenderness in the left lower quadrant and  suprapubic regions.  There is some positive voluntary guarding; however,  the patient does not have any rebounding.  She does have  positive bowel  sounds and distention.  No obvious masses, herniae or scars are noted.  MUSCULOSKELETAL:  All four extremities are symmetric with no clubbing,  cyanosis or edema.  No joint effusions are noted.  SKIN:  No obvious rashes, lesions or masses.  NEUROLOGIC:  Cranial nerves II-XII  are grossly intact.  Deep tendon  reflexes exam deferred at this time.  PSYCH:  The patient is alert and oriented x3.  Her affect is  appropriate.   LABORATORY DATA/DIAGNOSTICS:  White blood cell count 12,100, which has  come down from 16,100 on admission yesterday, hemoglobin 12.9,  hematocrit 32.8, platelets 228,000.  Sodium 140, potassium 4, glucose  103, BUN 4, creatinine 0.75.  Liver function tests are all normal.   A CT scan that was done yesterday on November 12, 2007, showed sigmoid  diverticulitis with a 7 cm x 2.5 cm x 3.5 cm abscess along the  descending colon.   IMPRESSION:  Sigmoid diverticulitis with pelvic abscess.   PLAN:  At this time the patient will need evaluation by interventional  radiology for the placement of a percutaneous drain.  At this time will  make the patient n.p.o. as well as will hold the patient's Lovenox for  today.  At this time interventional radiology has already been called  and told about this patient.  We also agree at this time with the use of  IV Zosyn.  After the patient is evaluated for percutaneous drain, if  this is able to be placed once the patient comes  back, she will still need to continue to be n.p.o.  We will begin to  advance the patient once her pain begins to decrease and her clinical  presentation begins to improve.   At this time, any further recommendations will be per Dr. Joice Lofts L.  Freida Busman, after her examination of the patient.      Letha Cape, PA    ______________________________  Lennie Muckle, M.D.    KEO/MEDQ  D:  11/13/2007  T:  11/13/2007  Job:  [id number]   cc:   Wilson Singer, M.D.  Patrica Duel, M.D.

## 2011-05-23 LAB — COMPREHENSIVE METABOLIC PANEL
ALT: 10
ALT: 10
AST: 18
AST: 12
Albumin: 3.7
Albumin: 2.5 — ABNORMAL LOW
Alkaline Phosphatase: 56
Alkaline Phosphatase: 60
BUN: 13
CO2: 20
Calcium: 8.5
Chloride: 113 — ABNORMAL HIGH
Chloride: 111
GFR calc Af Amer: >60
GFR calc non Af Amer: >60
GFR calc non Af Amer: >60
Glucose, Bld: 141 — ABNORMAL HIGH
Glucose, Bld: 118 — ABNORMAL HIGH
Potassium: 3.5
Potassium: 4
Sodium: 140
Sodium: 139
Sodium: 140
Total Bilirubin: 0.6
Total Protein: 5.5 — ABNORMAL LOW

## 2011-05-23 LAB — DIFFERENTIAL
Basophils Absolute: 0
Eosinophils Absolute: 0
Eosinophils Relative: 1
Eosinophils Relative: 0
Lymphocytes Relative: 11 — ABNORMAL LOW
Lymphocytes Relative: 4 — ABNORMAL LOW
Lymphs Abs: 0.9
Monocytes Absolute: 0.5
Monocytes Relative: 2 — ABNORMAL LOW
Monocytes Relative: 3
Neutro Abs: 15 — ABNORMAL HIGH

## 2011-05-23 LAB — URINALYSIS, ROUTINE W REFLEX MICROSCOPIC
Bilirubin Urine: NEGATIVE
Glucose, UA: NEGATIVE
Hgb urine dipstick: NEGATIVE
Ketones, ur: >80 — AB
Nitrite: NEGATIVE
Protein, ur: NEGATIVE
Protein, ur: 30 — AB
Specific Gravity, Urine: 1.025
Urobilinogen, UA: 1
pH: 5.5

## 2011-05-23 LAB — ANAEROBIC CULTURE: Gram Stain: NONE SEEN

## 2011-05-23 LAB — CBC
HCT: 32.8 — ABNORMAL LOW
Hemoglobin: 10.9 — ABNORMAL LOW
Hemoglobin: 11.6 — ABNORMAL LOW
Hemoglobin: 11.7 — ABNORMAL LOW
Hemoglobin: 12.1
Hemoglobin: 13
MCHC: 34.5
MCHC: 33.3
MCHC: 33.3
MCHC: 33.5
MCHC: 33.6
MCV: 87.3
Platelets: 228
Platelets: 242
Platelets: 299
RBC: 3.76 — ABNORMAL LOW
RBC: 4
RBC: 4.23
RBC: 4.49
RDW: 13
RDW: 13.1
RDW: 13.2
RDW: 13.4
WBC: 8.5
WBC: 12.1 — ABNORMAL HIGH
WBC: 9.8

## 2011-05-23 LAB — I-STAT 8, (EC8 V) (CONVERTED LAB)
Acid-base deficit: 5 — ABNORMAL HIGH
BUN: 8
Bicarbonate: 19.5 — ABNORMAL LOW
Chloride: 109
Glucose, Bld: 108 — ABNORMAL HIGH
HCT: 42
Hemoglobin: 14.3
Operator id: 146091
Potassium: 3.3 — ABNORMAL LOW
Sodium: 138
TCO2: 21
pCO2, Ven: 33.5 — ABNORMAL LOW
pH, Ven: 7.373 — ABNORMAL HIGH

## 2011-05-23 LAB — D-DIMER, QUANTITATIVE: D-Dimer, Quant: 2.96 — ABNORMAL HIGH

## 2011-05-23 LAB — CULTURE, ROUTINE-ABSCESS
Culture: NO GROWTH
Gram Stain: NONE SEEN

## 2011-05-23 LAB — CARDIAC PANEL(CRET KIN+CKTOT+MB+TROPI)
CK, MB: 1.3
Relative Index: INVALID
Total CK: 22
Total CK: 25
Total CK: 25
Troponin I: 0.01

## 2011-05-23 LAB — CULTURE, BLOOD (ROUTINE X 2)
Culture: NO GROWTH
Culture: NO GROWTH

## 2011-05-23 LAB — BASIC METABOLIC PANEL
BUN: 4 — ABNORMAL LOW
CO2: 22
Calcium: 8.6
GFR calc non Af Amer: >60
Glucose, Bld: 101 — ABNORMAL HIGH

## 2011-05-23 LAB — URINE MICROSCOPIC-ADD ON

## 2011-05-23 LAB — PREGNANCY, URINE: Preg Test, Ur: NEGATIVE

## 2011-05-23 LAB — POCT I-STAT CREATININE
Creatinine, Ser: 0.9
Operator id: 146091

## 2011-05-23 LAB — TSH: TSH: 1.704

## 2011-05-23 LAB — MAGNESIUM: Magnesium: 1.8

## 2011-05-23 LAB — T4, FREE: Free T4: 1.24

## 2011-05-23 LAB — T3: T3, Total: 85.7 (ref 80.0–204.0)

## 2011-05-26 LAB — DIFFERENTIAL
Lymphs Abs: 1.3
Monocytes Absolute: 0.4
Monocytes Relative: 7
Neutro Abs: 4.6
Neutrophils Relative %: 72

## 2011-05-26 LAB — CBC
Hemoglobin: 14.8
MCHC: 34.3
MCV: 87
RBC: 4.97
WBC: 6.3

## 2011-05-27 LAB — DIFFERENTIAL
Basophils Relative: 0
Lymphocytes Relative: 19
Lymphs Abs: 1.7
Monocytes Relative: 4
Neutro Abs: 6.7
Neutrophils Relative %: 77

## 2011-05-27 LAB — CBC
HCT: 38.5
Hemoglobin: 13.1
RBC: 4.74
RDW: 12.6
WBC: 8.8

## 2011-05-27 LAB — BASIC METABOLIC PANEL
CO2: 25
Chloride: 105
GFR calc non Af Amer: >60
Glucose, Bld: 173 — ABNORMAL HIGH
Potassium: 4.1
Sodium: 135

## 2011-05-27 LAB — PREGNANCY, URINE: Preg Test, Ur: NEGATIVE

## 2011-06-03 ENCOUNTER — Other Ambulatory Visit: Payer: Self-pay | Admitting: Gastroenterology

## 2011-06-03 DIAGNOSIS — R140 Abdominal distension (gaseous): Secondary | ICD-10-CM

## 2011-06-07 ENCOUNTER — Ambulatory Visit
Admission: RE | Admit: 2011-06-07 | Discharge: 2011-06-07 | Disposition: A | Discharge status: Home / Self Care | Payer: BC Managed Care – PPO | Source: Ambulatory Visit | Attending: Gastroenterology | Admitting: Gastroenterology

## 2011-06-07 DIAGNOSIS — R140 Abdominal distension (gaseous): Secondary | ICD-10-CM

## 2011-06-07 MED ORDER — IOHEXOL 300 MG/ML  SOLN
100.0000 mL | Freq: Once | INTRAMUSCULAR | Status: AC | PRN
  Administered 2011-06-07: 100 mL via INTRAVENOUS

## 2012-01-30 ENCOUNTER — Other Ambulatory Visit: Payer: Self-pay | Admitting: Gastroenterology

## 2012-08-13 ENCOUNTER — Emergency Department (HOSPITAL_COMMUNITY): Payer: BC Managed Care – PPO

## 2012-08-13 ENCOUNTER — Emergency Department (HOSPITAL_COMMUNITY)
Admission: EM | Admit: 2012-08-13 | Discharge: 2012-08-13 | Disposition: A | Discharge status: Home / Self Care | Payer: BC Managed Care – PPO | Attending: Emergency Medicine | Admitting: Emergency Medicine

## 2012-08-13 ENCOUNTER — Encounter (HOSPITAL_COMMUNITY): Payer: Self-pay

## 2012-08-13 DIAGNOSIS — N23 Unspecified renal colic: Secondary | ICD-10-CM

## 2012-08-13 DIAGNOSIS — R110 Nausea: Secondary | ICD-10-CM | POA: Insufficient documentation

## 2012-08-13 DIAGNOSIS — Z79899 Other long term (current) drug therapy: Secondary | ICD-10-CM | POA: Insufficient documentation

## 2012-08-13 DIAGNOSIS — N200 Calculus of kidney: Secondary | ICD-10-CM | POA: Insufficient documentation

## 2012-08-13 DIAGNOSIS — Z7982 Long term (current) use of aspirin: Secondary | ICD-10-CM | POA: Insufficient documentation

## 2012-08-13 LAB — URINALYSIS, ROUTINE W REFLEX MICROSCOPIC
Bilirubin Urine: NEGATIVE
Hgb urine dipstick: NEGATIVE
Specific Gravity, Urine: 1.025 (ref 1.005–1.030)
Urobilinogen, UA: 0.2 mg/dL (ref 0.0–1.0)

## 2012-08-13 MED ORDER — OXYCODONE-ACETAMINOPHEN 5-325 MG PO TABS
1.0000 | ORAL_TABLET | Freq: Once | ORAL | Status: AC
  Administered 2012-08-13: 1 via ORAL
  Filled 2012-08-13: qty 1

## 2012-08-13 MED ORDER — TAMSULOSIN HCL 0.4 MG PO CAPS: 0.4000 mg | ORAL_CAPSULE | Freq: Every day | ORAL | Status: DC

## 2012-08-13 MED ORDER — OXYCODONE-ACETAMINOPHEN 5-325 MG PO TABS: 1.0000 | ORAL_TABLET | ORAL | Status: AC | PRN

## 2012-08-13 NOTE — ED Notes (Signed)
Pt reports pain and "bloating" to her rt flank area.  Pt denies any hematuria.

## 2012-08-13 NOTE — ED Provider Notes (Signed)
History     CSN: [id number]  Arrival date & time 08/13/12  1151   First MD Initiated Contact with Patient 08/13/12 1520      Chief Complaint  Patient presents with  . Flank Pain    (Consider location/radiation/quality/duration/timing/severity/associated sxs/prior treatment) HPI Comments: The patient is a 51 year old woman who says she had the onset of flank pain on Saturday, 2 days ago. The pain was felt in the right flank and is a pressure-like feeling. She's had something similar 6 weeks ago that one way and 12 hours. The pain was quite severe on Saturday, seemed to remit somewhat yesterday, and was quite severe this morning. She therefore sought evaluation. She thinks the pain might be caused by a kidney stone.  Patient is a 51 y.o. female presenting with flank pain. The history is provided by the patient and medical records. No language interpreter was used.  Flank Pain This is a new problem. The current episode started 2 days ago. Episode frequency: Pain is present intermittently. The problem has been gradually worsening. Associated symptoms comments: No associated symptoms.. Nothing aggravates the symptoms. Nothing relieves the symptoms. She has tried nothing for the symptoms.    History reviewed. No pertinent past medical history.  Past Surgical History  Procedure Date  . Colon surgery     No family history on file.  History  Substance Use Topics  . Smoking status: Never Smoker   . Smokeless tobacco: Not on file  . Alcohol Use: Yes    OB History    Grav Para Term Preterm Abortions TAB SAB Ect Mult Living                  Review of Systems  Constitutional: Negative.  Negative for fever.  HENT: Negative.   Eyes: Negative.   Respiratory: Negative.   Cardiovascular: Negative.   Gastrointestinal: Positive for nausea. Negative for vomiting and diarrhea.  Genitourinary: Positive for flank pain.  Musculoskeletal: Negative.   Skin: Negative.  Negative for rash.   Neurological: Negative.   Psychiatric/Behavioral: Negative.     Allergies  Augmentin  Home Medications   Current Outpatient Rx  Name  Route  Sig  Dispense  Refill  . ACETAMINOPHEN 500 MG PO TABS   Oral   Take 1,000 mg by mouth every 6 (six) hours as needed. Pain         . ASPIRIN EC 81 MG PO TBEC   Oral   Take 81 mg by mouth daily.         Marland Kitchen HYOSYNE PO   Oral   Take 0.375 mg by mouth daily as needed. Stomach Cramps         . IBUPROFEN 200 MG PO TABS   Oral   Take 200 mg by mouth every 6 (six) hours as needed. Pain         . MEDROXYPROGESTERONE ACETATE 150 MG/ML IM SUSP   Intramuscular   Inject 150 mg into the muscle every 3 (three) months. Due on Wednesday (08/15/12)         . ADULT MULTIVITAMIN W/MINERALS CH   Oral   Take 1 tablet by mouth daily.         Marland Kitchen OVER THE COUNTER MEDICATION   Oral   Take 3 capsules by mouth daily. Herbal Supplement with 10 different herbs.         . SUMATRIPTAN SUCCINATE 100 MG PO TABS   Oral   Take 100 mg by mouth every 2 (  two) hours as needed. Migraine           BP 129/82  Pulse 102  Temp 98.6 F (37 C) (Oral)  Resp 20  Ht 5' (1.524 m)  Wt 150 lb (68.04 kg)  BMI 29.30 kg/m2  SpO2 100%  Physical Exam  Nursing note and vitals reviewed. Constitutional: She is oriented to person, place, and time. She appears well-developed and well-nourished.       Middle-aged woman in mild to moderate distress with right flank pain.  HENT:  Head: Normocephalic and atraumatic.  Right Ear: External ear normal.  Left Ear: External ear normal.  Mouth/Throat: Oropharynx is clear and moist.  Eyes: Conjunctivae normal and EOM are normal. Pupils are equal, round, and reactive to light. No scleral icterus.  Neck: Normal range of motion. Neck supple.  Cardiovascular: Normal rate, regular rhythm and normal heart sounds.   Pulmonary/Chest: Effort normal and breath sounds normal.  Abdominal: Soft. Bowel sounds are normal.   Musculoskeletal:       She localizes pain to the right CVA region. There is no mass or point tenderness there.  Neurological: She is alert and oriented to person, place, and time.       No sensory or motor deficit.  Skin: Skin is warm and dry. She is not diaphoretic.  Psychiatric: She has a normal mood and affect. Her behavior is normal.    ED Course  Procedures (including critical care time)   3:40 PM Patient was seen and had physical examination. Urinalysis was negative. CT of the abdomen without contrast was ordered. Oral Percocet was ordered.  4:31 PM Going to Radiology for her CT scan.  5:07 PM Results for orders placed during the hospital encounter of 08/13/12  URINALYSIS, ROUTINE W REFLEX MICROSCOPIC      Component Value Range   Color, Urine YELLOW  YELLOW   APPearance CLEAR  CLEAR   Specific Gravity, Urine 1.025  1.005 - 1.030   pH 5.5  5.0 - 8.0   Glucose, UA NEGATIVE  NEGATIVE mg/dL   Hgb urine dipstick NEGATIVE  NEGATIVE   Bilirubin Urine NEGATIVE  NEGATIVE   Ketones, ur TRACE (*) NEGATIVE mg/dL   Protein, ur NEGATIVE  NEGATIVE mg/dL   Urobilinogen, UA 0.2  0.0 - 1.0 mg/dL   Nitrite NEGATIVE  NEGATIVE   Leukocytes, UA NEGATIVE  NEGATIVE   Ct Abdomen Pelvis Wo Contrast  08/13/2012  *RADIOLOGY REPORT*  Clinical Data: Right flank pain for 2 days.  CT ABDOMEN AND PELVIS WITHOUT CONTRAST  Technique:  Multidetector CT imaging of the abdomen and pelvis was performed following the standard protocol without intravenous contrast.  Comparison: Abdominal pelvic CT 06/07/2011.  Findings: There is mild linear scarring in both lung bases.  The lung bases are otherwise clear.  There is no pleural effusion.  There are small calyceal calculi in the lower poles of both kidneys, more numerous on the right.  There is no hydronephrosis or perinephric soft tissue stranding.  There is no evidence of ureteral or bladder calculus.  A low density lesion in the interpolar region of the right  kidney on image 30 is unchanged from the prior study, corresponding with a cyst.  Low density hepatic lesions are also grossly unchanged, corresponding with cysts and at least one hemangioma (measuring 1.7 cm in the posterior segment right lobe on image 17).  The spleen, gallbladder, pancreas and adrenal glands appear unremarkable.  There are postsurgical changes status post distal colon resection and reanastomosis.  Adjacent diverticula appear unchanged.  No inflammatory changes are identified.  The appendix appears normal.  There is a broad-based ventral hernia containing fat which is unchanged.  The uterus, ovaries and bladder appear unremarkable. There are stable asymmetric degenerative changes at the right hip with prominent subchondral cyst formation in the acetabular roof. No acute osseous findings are seen.  IMPRESSION:  1.  Bilateral renal calyceal calculi.  No evidence of ureteral calculus or hydronephrosis. 2.  Grossly stable low-density renal and hepatic lesions. 3.  Stable postsurgical changes status post distal colon resection and anastomosis.  No acute inflammatory changes are identified. 4.  Stable broad-based ventral infraumbilical hernia.   Original Report Authenticated By: Carey Bullocks, M.D.     5:08 PM X-ray shows bilateral renal stones, but no ureteral stone.  Rx Percocet for pain, Flomax to facilitate stone passage of any radio-opaque stone, F/U with Catalina Pizza, M.D., her PCP.   1. Renal colic on right side      Carleene Cooper III, MD 08/13/12 [phone]

## 2013-01-29 ENCOUNTER — Other Ambulatory Visit: Payer: Self-pay | Admitting: Obstetrics and Gynecology

## 2013-03-15 ENCOUNTER — Other Ambulatory Visit (HOSPITAL_COMMUNITY): Payer: Self-pay | Admitting: Sports Medicine

## 2013-03-15 DIAGNOSIS — M25562 Pain in left knee: Secondary | ICD-10-CM

## 2013-03-20 ENCOUNTER — Other Ambulatory Visit (HOSPITAL_COMMUNITY): Payer: BC Managed Care – PPO

## 2013-03-20 ENCOUNTER — Ambulatory Visit (HOSPITAL_COMMUNITY)
Admission: RE | Admit: 2013-03-20 | Discharge: 2013-03-20 | Disposition: A | Discharge status: Home / Self Care | Payer: BC Managed Care – PPO | Source: Ambulatory Visit | Attending: Sports Medicine | Admitting: Sports Medicine

## 2013-03-20 DIAGNOSIS — M2390 Unspecified internal derangement of unspecified knee: Secondary | ICD-10-CM | POA: Insufficient documentation

## 2013-03-20 DIAGNOSIS — M25569 Pain in unspecified knee: Secondary | ICD-10-CM | POA: Insufficient documentation

## 2013-03-20 DIAGNOSIS — IMO0002 Reserved for concepts with insufficient information to code with codable children: Secondary | ICD-10-CM | POA: Insufficient documentation

## 2013-03-20 DIAGNOSIS — M7120 Synovial cyst of popliteal space [Baker], unspecified knee: Secondary | ICD-10-CM | POA: Insufficient documentation

## 2013-03-20 DIAGNOSIS — M1710 Unilateral primary osteoarthritis, unspecified knee: Secondary | ICD-10-CM | POA: Insufficient documentation

## 2013-03-20 DIAGNOSIS — M25562 Pain in left knee: Secondary | ICD-10-CM

## 2013-07-10 ENCOUNTER — Encounter (HOSPITAL_COMMUNITY): Payer: Self-pay | Admitting: Pharmacy Technician

## 2013-07-10 ENCOUNTER — Other Ambulatory Visit: Payer: Self-pay | Admitting: Physician Assistant

## 2013-07-10 ENCOUNTER — Encounter: Payer: Self-pay | Admitting: Physician Assistant

## 2013-07-10 DIAGNOSIS — G43909 Migraine, unspecified, not intractable, without status migrainosus: Secondary | ICD-10-CM | POA: Insufficient documentation

## 2013-07-10 DIAGNOSIS — Z8614 Personal history of Methicillin resistant Staphylococcus aureus infection: Secondary | ICD-10-CM | POA: Insufficient documentation

## 2013-07-11 ENCOUNTER — Other Ambulatory Visit: Payer: Self-pay | Admitting: Physician Assistant

## 2013-07-11 ENCOUNTER — Encounter: Payer: Self-pay | Admitting: Physician Assistant

## 2013-07-11 DIAGNOSIS — K5730 Diverticulosis of large intestine without perforation or abscess without bleeding: Secondary | ICD-10-CM

## 2013-07-11 DIAGNOSIS — N200 Calculus of kidney: Secondary | ICD-10-CM | POA: Insufficient documentation

## 2013-07-11 DIAGNOSIS — R011 Cardiac murmur, unspecified: Secondary | ICD-10-CM | POA: Insufficient documentation

## 2013-07-11 DIAGNOSIS — M1712 Unilateral primary osteoarthritis, left knee: Secondary | ICD-10-CM

## 2013-07-11 NOTE — H&P (Addendum)
TOTAL KNEE ADMISSION H&P  Patient is being admitted for left total vs uni knee arthroplasty.  Subjective:  Chief Complaint:left knee pain.  HPI: Heidi Webster, 52 y.o. female, has a history of pain and functional disability in the left knee due to arthritis and has failed non-surgical conservative treatments for greater than 12 weeks to includeNSAID's and/or analgesics, corticosteriod injections, viscosupplementation injections, flexibility and strengthening excercises, use of assistive devices, weight reduction as appropriate and activity modification.  Onset of symptoms was abrupt, starting 1 years ago with rapidlly worsening course since that time. The patient noted no past surgery on the left knee(s).  Patient currently rates pain in the left knee(s) at 10 out of 10 with activity. Patient has night pain, worsening of pain with activity and weight bearing, pain that interferes with activities of daily living, crepitus and joint swelling.  Patient has evidence of subchondral sclerosis, periarticular osteophytes and joint space narrowing by imaging studies.  There is no active infection.  Patient Active Problem List   Diagnosis Date Noted  . Diverticula of colon   . Kidney stone   . Left knee DJD   . Heart murmur   . Migraines   . Hx MRSA infection    Past Medical History  Diagnosis Date  . Migraines   . Hx MRSA infection 2009  . Diverticula of colon 2009  . Kidney stone 2013  . Left knee DJD   . Heart murmur     Past Surgical History  Procedure Laterality Date  . Colon surgery  2009    perforated Diverticula  . Shoulder capsulorrhaphy Right 1991     (Not in a hospital admission) Allergies  Allergen Reactions  . Augmentin [Amoxicillin-Pot Clavulanate] Rash    Current Outpatient Prescriptions on File Prior to Visit  Medication Sig Dispense Refill  . acetaminophen (TYLENOL) 500 MG tablet Take 1,000 mg by mouth every 6 (six) hours as needed. Pain      . aspirin EC 81 MG  tablet Take 81 mg by mouth daily.      Marland Kitchen Hyoscyamine Sulfate (HYOSYNE PO) Take 0.375 mg by mouth daily as needed. Stomach Cramps      . ibuprofen (ADVIL,MOTRIN) 200 MG tablet Take 400 mg by mouth every 6 (six) hours as needed. Pain      . Multiple Vitamin (MULTIVITAMIN WITH MINERALS) TABS Take 1 tablet by mouth See admin instructions. Take 1 tablet Monday - Friday      . OVER THE COUNTER MEDICATION Take 2 tablets by mouth See admin instructions. Take 2 tablets Monday- Friday    Herbal Supplement with 10 different herbs.      . SUMAtriptan (IMITREX) 100 MG tablet Take 100 mg by mouth every 2 (two) hours as needed. Migraine       No current facility-administered medications on file prior to visit.    History  Substance Use Topics  . Smoking status: Never Smoker   . Smokeless tobacco: Not on file  . Alcohol Use: Yes     Comment: 1 drink a week    Family History  Problem Relation Age of Onset  . Diabetes Mother   . Heart disease Mother   . Heart disease Father   . Arthritis-Osteo Father      Review of Systems  Constitutional: Negative.   HENT: Negative.   Eyes: Negative.   Respiratory: Negative.   Cardiovascular: Negative.   Gastrointestinal: Negative.   Genitourinary: Negative.   Musculoskeletal: Positive for joint pain.  Skin: Negative.   Neurological: Negative.   Endo/Heme/Allergies: Negative.   Psychiatric/Behavioral: Negative.     Objective:  Physical Exam  Constitutional: She is oriented to person, place, and time. She appears well-developed and well-nourished.  HENT:  Head: Normocephalic and atraumatic.  Eyes: Conjunctivae are normal. Pupils are equal, round, and reactive to light.  Neck: Neck supple.  Cardiovascular: Normal rate, regular rhythm and intact distal pulses.   Murmur heard. Respiratory: Effort normal and breath sounds normal.  GI: Soft. Bowel sounds are normal.  Genitourinary:  Not pertinent to current symptomatology therefore not examined.   Musculoskeletal:  Examination of her left knee reveals pain on the medial joint line positive medial McMurray's 1+ effusion full range of motion knee is stable with normal patella tracking.   Neurological: She is alert and oriented to person, place, and time.  Skin: Skin is warm and dry.  Psychiatric: She has a normal mood and affect. Her behavior is normal.    Vital signs in last 24 hours: Last recorded: 11/13 0900   BP: 137/84 Pulse: 106  Temp: 97.7 F (36.5 C)    Height: 5' (1.524 m) SpO2: 98  Weight: 69.4 kg (153 lb)     Labs:   Estimated body mass index is 29.88 kg/(m^2) as calculated from the following:   Height as of this encounter: 5' (1.524 m).   Weight as of this encounter: 69.4 kg (153 lb).   Imaging Review Plain radiographs demonstrate severe degenerative joint disease of the left knee(s). The overall alignment ismild varus. The bone quality appears to be good for age and reported activity level.  Assessment/Plan:  End stage arthritis, left knee   The patient history, physical examination, clinical judgment of the provider and imaging studies are consistent with end stage degenerative joint disease of the left knee(s) and total knee arthroplasty is deemed medically necessary. The treatment options including medical management, injection therapy arthroscopy and arthroplasty were discussed at length. The risks and benefits of total knee arthroplasty were presented and reviewed. The risks due to aseptic loosening, infection, stiffness, patella tracking problems, thromboembolic complications and other imponderables were discussed. The patient acknowledged the explanation, agreed to proceed with the plan and consent was signed. Patient is being admitted for inpatient treatment for surgery, pain control, PT, OT, prophylactic antibiotics, VTE prophylaxis, progressive ambulation and ADL's and discharge planning. The patient is planning to be discharged home with home health  services  Will do cardiac evaluation prior to surgery due to long standing murmur and history of mom dying of a staph infection attributed to bacterial encarditis  Keiyon Plack A. Gwinda Passe Physician Assistant [facility] [phone]  07/11/2013, 9:39 AM

## 2013-07-13 NOTE — Pre-Procedure Instructions (Signed)
TONISHA SILVEY  07/13/2013   Your procedure is scheduled on:  November 24  Report to [facility] Entrance "A" [address] at [employer] AM.  Call this number if you have problems the morning of surgery: [phone]   Remember:   Do not eat food or drink liquids after midnight.   Take these medicines the morning of surgery with A SIP OF WATER: Hyoscyamine (if needed)   STOP Ibuprofen, Multiple Vitamins, OTC herbal supplement today   STOP Aspirin, Aleve, Naproxen, Advil, Ibuprofen, Vitamin, Herbs, and Supplements starting today   Do not wear jewelry, make-up or nail polish.  Do not wear lotions, powders, or perfumes. You may wear deodorant.  Do not shave 48 hours prior to surgery. Men may shave face and neck.  Do not bring valuables to the hospital.  [facility] is not responsible                  for any belongings or valuables.               Contacts, dentures or bridgework may not be worn into surgery.  Leave suitcase in the car. After surgery it may be brought to your room.  For patients admitted to the hospital, discharge time is determined by your                treatment team.               Special Instructions: Shower using CHG 2 nights before surgery and the night before surgery.  If you shower the day of surgery use CHG.  Use special wash - you have one bottle of CHG for all showers.  You should use approximately 1/3 of the bottle for each shower.   Please read over the following fact sheets that you were given: Pain Booklet, Coughing and Deep Breathing, Blood Transfusion Information, Total Joint Packet and Surgical Site Infection Prevention

## 2013-07-15 ENCOUNTER — Ambulatory Visit (HOSPITAL_COMMUNITY)
Admission: RE | Admit: 2013-07-15 | Discharge: 2013-07-15 | Disposition: A | Discharge status: Home / Self Care | Payer: BC Managed Care – PPO | Source: Ambulatory Visit | Attending: Physician Assistant | Admitting: Physician Assistant

## 2013-07-15 ENCOUNTER — Other Ambulatory Visit: Payer: Self-pay | Admitting: Physician Assistant

## 2013-07-15 ENCOUNTER — Encounter (HOSPITAL_COMMUNITY): Payer: Self-pay

## 2013-07-15 ENCOUNTER — Encounter (HOSPITAL_COMMUNITY)
Admission: RE | Admit: 2013-07-15 | Discharge: 2013-07-15 | Disposition: A | Discharge status: Home / Self Care | Payer: BC Managed Care – PPO | Source: Ambulatory Visit | Attending: Orthopedic Surgery | Admitting: Orthopedic Surgery

## 2013-07-15 DIAGNOSIS — Z5309 Procedure and treatment not carried out because of other contraindication: Secondary | ICD-10-CM | POA: Insufficient documentation

## 2013-07-15 DIAGNOSIS — Z01812 Encounter for preprocedural laboratory examination: Secondary | ICD-10-CM | POA: Insufficient documentation

## 2013-07-15 LAB — CBC WITH DIFFERENTIAL/PLATELET
Basophils Absolute: 0 10*3/uL (ref 0.0–0.1)
Basophils Relative: 0 % (ref 0–1)
Eosinophils Relative: 0 % (ref 0–5)
HCT: 42.3 % (ref 36.0–46.0)
Hemoglobin: 13.9 g/dL (ref 12.0–15.0)
Lymphs Abs: 1.5 10*3/uL (ref 0.7–4.0)
MCHC: 32.9 g/dL (ref 30.0–36.0)
MCV: 92.6 fL (ref 78.0–100.0)
Monocytes Absolute: 0.4 10*3/uL (ref 0.1–1.0)
Monocytes Relative: 5 % (ref 3–12)
Neutro Abs: 5.9 10*3/uL (ref 1.7–7.7)
RDW: 12.3 % (ref 11.5–15.5)

## 2013-07-15 LAB — COMPREHENSIVE METABOLIC PANEL
Albumin: 4.2 g/dL (ref 3.5–5.2)
BUN: 14 mg/dL (ref 6–23)
CO2: 24 mEq/L (ref 19–32)
Calcium: 9.8 mg/dL (ref 8.4–10.5)
Chloride: 105 mEq/L (ref 96–112)
Creatinine, Ser: 0.79 mg/dL (ref 0.50–1.10)
GFR calc non Af Amer: >90 mL/min (ref >90)
Glucose, Bld: 87 mg/dL (ref 70–99)
Total Bilirubin: 0.5 mg/dL (ref 0.3–1.2)

## 2013-07-15 LAB — URINALYSIS, ROUTINE W REFLEX MICROSCOPIC
Bilirubin Urine: NEGATIVE
Glucose, UA: NEGATIVE mg/dL
Hgb urine dipstick: NEGATIVE
Ketones, ur: NEGATIVE mg/dL
Urobilinogen, UA: 0.2 mg/dL (ref 0.0–1.0)
pH: 6 (ref 5.0–8.0)

## 2013-07-15 LAB — APTT: aPTT: 33 seconds (ref 24–37)

## 2013-07-15 LAB — PROTIME-INR
INR: 1.04 (ref 0.00–1.49)
Prothrombin Time: 13.4 seconds (ref 11.6–15.2)

## 2013-07-15 LAB — TYPE AND SCREEN: ABO/RH(D): O POS

## 2013-07-15 LAB — ABO/RH: ABO/RH(D): O POS

## 2013-07-15 MED ORDER — CHLORHEXIDINE GLUCONATE 4 % EX LIQD: 60.0000 mL | Freq: Once | CUTANEOUS | Status: DC

## 2013-07-16 ENCOUNTER — Encounter (HOSPITAL_COMMUNITY): Payer: Self-pay | Admitting: Vascular Surgery

## 2013-07-16 LAB — URINE CULTURE

## 2013-07-16 NOTE — Progress Notes (Addendum)
Anesthesia Chart Review:  Patient is a 52 year old female scheduled for left unicompartmental versus TKR on 07/22/13 by Dr. Thurston Hole.  History includes non-smoker, migraines, nephrolithiasis, heart murmur, sigmoid colon resection due to diverticulitis with abscess 03/2008, MRSA '09. PCP is listed as Dr. Catalina Pizza.  Patient is seeing cardiologist Dr. Jacinto Halim for clearance on 07/17/13.    EKG on 07/15/13 showed ST @ 104 bpm, inferior infarct (age undetermined), possible anterior infarct (age undetermined).  Lateral T wave abnormality.  Preoperative CXR and labs noted.  Will follow-up records from [place] [facility] once available.  Velna Ochs [facility]/Anesthesiology Phone [phone] 07/16/2013 12:45 PM  Addendum: 07/19/2013 3:50 PM Patient ws seen by Dr. Jacinto Halim on 07/17/13.  He is ordering a Lexiscan and possibly an echo preoperatively--one of which was being done today.  Reports are not yet available; however, Sherri at Dr Sherene Sires office just received a phone call from Dr. Jacinto Halim stating that patient was cleared for surgery Monday.  She will fax over his clearance records once received.  (Update 17:25 PM:  Received another phone call from [facility], and after further review Dr. Jacinto Halim feels testing revealed need for further evaluation.  Surgery has been canceled for Monday.)

## 2013-07-19 ENCOUNTER — Ambulatory Visit: Payer: BC Managed Care – PPO | Admitting: Internal Medicine

## 2013-07-22 ENCOUNTER — Encounter (HOSPITAL_COMMUNITY): Admission: RE | Payer: Self-pay | Source: Ambulatory Visit

## 2013-07-22 ENCOUNTER — Inpatient Hospital Stay (HOSPITAL_COMMUNITY)
Admission: RE | Admit: 2013-07-22 | Payer: BC Managed Care – PPO | Source: Ambulatory Visit | Admitting: Orthopedic Surgery

## 2013-07-22 SURGERY — ARTHROPLASTY, KNEE, TOTAL
Anesthesia: General | Laterality: Left

## 2013-08-13 ENCOUNTER — Other Ambulatory Visit (HOSPITAL_COMMUNITY): Payer: Self-pay | Admitting: Cardiology

## 2013-08-13 DIAGNOSIS — I422 Other hypertrophic cardiomyopathy: Secondary | ICD-10-CM

## 2013-08-13 DIAGNOSIS — I428 Other cardiomyopathies: Secondary | ICD-10-CM

## 2013-08-26 ENCOUNTER — Ambulatory Visit (HOSPITAL_COMMUNITY): Payer: BC Managed Care – PPO

## 2013-08-27 ENCOUNTER — Encounter (HOSPITAL_COMMUNITY): Payer: Self-pay | Admitting: Pharmacy Technician

## 2013-08-29 HISTORY — PX: CARDIAC CATHETERIZATION: SHX172

## 2013-09-02 MED ORDER — VANCOMYCIN HCL IN DEXTROSE 1-5 GM/200ML-% IV SOLN
1000.0000 mg | INTRAVENOUS | Status: DC
  Filled 2013-09-02: qty 200

## 2013-09-02 MED ORDER — SODIUM CHLORIDE 0.9 % IV SOLN
1000.0000 mg | INTRAVENOUS | Status: DC
  Filled 2013-09-02: qty 10

## 2013-09-03 ENCOUNTER — Encounter (HOSPITAL_COMMUNITY)
Admission: RE | Disposition: A | Discharge status: Home / Self Care | Payer: Self-pay | Source: Ambulatory Visit | Attending: Cardiology

## 2013-09-03 ENCOUNTER — Ambulatory Visit (HOSPITAL_COMMUNITY)
Admission: RE | Admit: 2013-09-03 | Discharge: 2013-09-03 | Disposition: A | Discharge status: Home / Self Care | Payer: BC Managed Care – PPO | Source: Ambulatory Visit | Attending: Cardiology | Admitting: Cardiology

## 2013-09-03 ENCOUNTER — Other Ambulatory Visit: Payer: Self-pay

## 2013-09-03 DIAGNOSIS — IMO0002 Reserved for concepts with insufficient information to code with codable children: Secondary | ICD-10-CM | POA: Insufficient documentation

## 2013-09-03 DIAGNOSIS — M1710 Unilateral primary osteoarthritis, unspecified knee: Secondary | ICD-10-CM | POA: Insufficient documentation

## 2013-09-03 DIAGNOSIS — I498 Other specified cardiac arrhythmias: Secondary | ICD-10-CM | POA: Insufficient documentation

## 2013-09-03 DIAGNOSIS — R079 Chest pain, unspecified: Secondary | ICD-10-CM | POA: Insufficient documentation

## 2013-09-03 DIAGNOSIS — Z7982 Long term (current) use of aspirin: Secondary | ICD-10-CM | POA: Insufficient documentation

## 2013-09-03 DIAGNOSIS — R9389 Abnormal findings on diagnostic imaging of other specified body structures: Secondary | ICD-10-CM | POA: Insufficient documentation

## 2013-09-03 DIAGNOSIS — E785 Hyperlipidemia, unspecified: Secondary | ICD-10-CM | POA: Insufficient documentation

## 2013-09-03 DIAGNOSIS — Z8249 Family history of ischemic heart disease and other diseases of the circulatory system: Secondary | ICD-10-CM | POA: Insufficient documentation

## 2013-09-03 DIAGNOSIS — R011 Cardiac murmur, unspecified: Secondary | ICD-10-CM | POA: Insufficient documentation

## 2013-09-03 DIAGNOSIS — I421 Obstructive hypertrophic cardiomyopathy: Secondary | ICD-10-CM | POA: Insufficient documentation

## 2013-09-03 DIAGNOSIS — I10 Essential (primary) hypertension: Secondary | ICD-10-CM | POA: Insufficient documentation

## 2013-09-03 HISTORY — PX: LEFT HEART CATHETERIZATION WITH CORONARY ANGIOGRAM: SHX5451

## 2013-09-03 SURGERY — LEFT HEART CATHETERIZATION WITH CORONARY ANGIOGRAM
Anesthesia: LOCAL

## 2013-09-03 MED ORDER — HEPARIN SODIUM (PORCINE) 1000 UNIT/ML IJ SOLN
INTRAMUSCULAR | Status: AC
  Filled 2013-09-03: qty 1

## 2013-09-03 MED ORDER — NITROGLYCERIN 0.2 MG/ML ON CALL CATH LAB
INTRAVENOUS | Status: AC
  Filled 2013-09-03: qty 1

## 2013-09-03 MED ORDER — IBUPROFEN 800 MG PO TABS
800.0000 mg | ORAL_TABLET | ORAL | Status: AC
  Administered 2013-09-03: 800 mg via ORAL

## 2013-09-03 MED ORDER — SODIUM CHLORIDE 0.9 % IV SOLN: 250.0000 mL | INTRAVENOUS | Status: DC | PRN

## 2013-09-03 MED ORDER — MIDAZOLAM HCL 2 MG/2ML IJ SOLN
INTRAMUSCULAR | Status: AC
  Filled 2013-09-03: qty 2

## 2013-09-03 MED ORDER — HYDROMORPHONE HCL PF 2 MG/ML IJ SOLN
INTRAMUSCULAR | Status: AC
  Filled 2013-09-03: qty 1

## 2013-09-03 MED ORDER — SODIUM CHLORIDE 0.9 % IV SOLN
INTRAVENOUS | Status: DC
Start: 2013-09-04 — End: 2013-09-03

## 2013-09-03 MED ORDER — ASPIRIN 81 MG PO CHEW
81.0000 mg | CHEWABLE_TABLET | ORAL | Status: AC
  Administered 2013-09-03: 81 mg via ORAL

## 2013-09-03 MED ORDER — SODIUM CHLORIDE 0.9 % IV SOLN: 1.0000 mL/kg/h | INTRAVENOUS | Status: DC

## 2013-09-03 MED ORDER — ONDANSETRON HCL 4 MG/2ML IJ SOLN: 4.0000 mg | Freq: Four times a day (QID) | INTRAMUSCULAR | Status: DC | PRN

## 2013-09-03 MED ORDER — HEPARIN (PORCINE) IN NACL 2-0.9 UNIT/ML-% IJ SOLN
INTRAMUSCULAR | Status: AC
  Filled 2013-09-03: qty 1000

## 2013-09-03 MED ORDER — LIDOCAINE HCL (PF) 1 % IJ SOLN
INTRAMUSCULAR | Status: AC
Start: 2013-09-03 — End: 2013-09-03
  Filled 2013-09-03: qty 30

## 2013-09-03 MED ORDER — DIAZEPAM 5 MG PO TABS
ORAL_TABLET | ORAL | Status: AC
  Filled 2013-09-03: qty 2

## 2013-09-03 MED ORDER — SODIUM CHLORIDE 0.9 % IJ SOLN: 3.0000 mL | Freq: Two times a day (BID) | INTRAMUSCULAR | Status: DC

## 2013-09-03 MED ORDER — ACETAMINOPHEN 325 MG PO TABS: 650.0000 mg | ORAL_TABLET | ORAL | Status: DC | PRN

## 2013-09-03 MED ORDER — SODIUM CHLORIDE 0.9 % IJ SOLN: 3.0000 mL | INTRAMUSCULAR | Status: DC | PRN

## 2013-09-03 MED ORDER — SODIUM CHLORIDE 0.9 % IV BOLUS (SEPSIS)
500.0000 mL | Freq: Once | INTRAVENOUS | Status: AC
  Administered 2013-09-03: 06:00:00 via INTRAVENOUS

## 2013-09-03 MED ORDER — DIAZEPAM 5 MG PO TABS
10.0000 mg | ORAL_TABLET | Freq: Once | ORAL | Status: AC
  Administered 2013-09-03: 10 mg via ORAL

## 2013-09-03 MED ORDER — ASPIRIN 81 MG PO CHEW
CHEWABLE_TABLET | ORAL | Status: AC
  Filled 2013-09-03: qty 1

## 2013-09-03 MED ORDER — IBUPROFEN 600 MG PO TABS: 600.0000 mg | ORAL_TABLET | ORAL | Status: DC

## 2013-09-03 MED ORDER — VERAPAMIL HCL 2.5 MG/ML IV SOLN
INTRAVENOUS | Status: AC
  Filled 2013-09-03: qty 2

## 2013-09-03 NOTE — Progress Notes (Signed)
Discharge instructions reviewed with pt and family verbalize understanding

## 2013-09-03 NOTE — CV Procedure (Addendum)
Procedure performed:  Left heart catheterization including hemodynamic monitoring of the left ventricle, LV gram, selective right and left coronary arteriography.  Indication patient is a 53 year-old female with history of hypertension,  hyperlipidemia, who presents with chest pain. Patient has  had abnormal non invasive testing which was high risk.  Hence is brought to the cardiac catheterization lab to evaluate the  coronary anatomy for definitive diagnosis of CAD. She also has an abnormal echocardiogram suggestive of HOCM.  Hemodynamic data:  Left ventricular apex pressure was 170/19 with end-diastolic pressure of 25 mm mercury. Left ventricle are base pressure was 94/14 with LVEDP of 21 mm mercury. There was a peak to peak pressure intraventricular pressure gradient of 76 mmHg. Aortic pressure was 99/65 with a mean of 83 mm mercury. There was no pressure gradient across the aortic valve. The findings are suggestive of intraventricular pressure gradient and post PVC elevation and pressure gradient suggest LVOT obstruction.  Left ventricle: Performed in the RAO projection revealed LVEF of 65-70%. There was No MR. No wall motion abnormality.  Right coronary artery: The vessel is smooth, normal,  Dominant. Large PDA and large PL branches.  Left main coronary artery is large and normal.  Circumflex coronary artery: A large vessel giving origin to a large obtuse marginal 1. It is smooth and normal.   Ramus intermediate: Small to moderate vessel, smooth and normal.  LAD:  LAD gives origin to a large diagonal-1.  LAD has mild luminal irregularities. Ostial LAD has eccentric 20% stenosis. Ostial diagonal have minimal disease.  Technique: Under sterile precautions using a 6 French right radial  arterial access, a 6 French sheath was introduced into the right radial artery. A 5 [place] Tig 4 catheter was advanced into the ascending aorta selective  right coronary artery and left coronary artery was  cannulated and angiography was performed in multiple views. The catheter was pulled back Out of the body over exchange length J-wire. Same Catheter was used to perform LV gram which was performed in RAO projection. The catheter exchanged for a 5 French pigtail catheter to evaluate the intraventricular pressure gradient.  Catheter exchanged out of the body over J-Wire. NO immediate complications noted. Patient tolerated the procedure well.   Rec: Medical therapy with aggressive risk factor reduction. Patient can undergo surgery with acceptable cardiovascular risk. Patient scheduled for knee replacement. She will need to avoid hypotension. 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  Disposition: Will be discharged home today with outpatient follow up.

## 2013-09-03 NOTE — Interval H&P Note (Signed)
History and Physical Interval Note:  09/03/2013 6:41 AM  Heidi Webster  has presented today for surgery, with the diagnosis of Chest pain  The various methods of treatment have been discussed with the patient and family. After consideration of risks, benefits and other options for treatment, the patient has consented to  Procedure(s): LEFT HEART CATHETERIZATION WITH CORONARY ANGIOGRAM (N/A)  Possible PCI as a surgical intervention .  The patient's history has been reviewed, patient examined, no change in status, stable for surgery.  I have reviewed the patient's chart and labs.  Questions were answered to the patient's satisfaction.   Cath Lab Visit (complete for each Cath Lab visit)  Clinical Evaluation Leading to the Procedure:   ACS: no  Non-ACS:    Anginal Classification: CCS III  Anti-ischemic medical therapy: Maximal Therapy (2 or more classes of medications)  Non-Invasive Test Results: High-risk stress test findings: cardiac mortality >3%/year  Prior CABG: No previous CABG         [facility]

## 2013-09-03 NOTE — Discharge Instructions (Signed)

## 2013-09-03 NOTE — H&P (Signed)
Heidi Webster is an 53 y.o. female.   Chief Complaint: Chest pain and presurgical evaluation HPI: Heidi Webster is a 53 year old Caucasian female who was referred to me for preoperative cardiovascular risk stratification. Patient has been told that she has baseline tachycardia, heart murmur, she was scheduled for left knee replacement. Due to sinus tachycardia and abnormal EKG she was referred to me.  She underwent echocardiogram and also stress testing and office, which was markedly abnormal. Hence her surgery was canceled, she now presents here to discuss her test results. No new symptomatology.  Patient has a history of diverticulosis and has had colectomy about 4-5 years ago in 2009. She had wound infection with MRSA at that time. Otherwise no other significant past medical history except for history of migraine with aura. She also has history of renal stones.  There is no history of premature coronary artery disease. She denies any shortness of breath, chest pain or palpitation. No PND or orthopnea. No recent weight changes.  Father has had Coronary artery disease and CABG but this was in his 32 years of age. Mother has had what appears to be infective endocarditis and died at age of 83 years.  Past Medical History  Diagnosis Date  . Migraines   . Hx MRSA infection 2009  . Diverticula of colon 2009  . Kidney stone 2013  . Left knee DJD   . Heart murmur     Past Surgical History  Procedure Laterality Date  . Colon surgery  2009    perforated Diverticula  . Shoulder capsulorrhaphy Right 1991    Family History  Problem Relation Age of Onset  . Diabetes Mother   . Heart disease Mother   . Heart disease Father   . Arthritis-Osteo Father    Social History:  reports that she has never smoked. She does not have any smokeless tobacco history on file. She reports that she drinks alcohol. She reports that she does not use illicit drugs.  Allergies:  Allergies  Allergen  Reactions  . Augmentin [Amoxicillin-Pot Clavulanate] Rash  . Tape Rash    Medications Prior to Admission  Medication Sig Dispense Refill  . acetaminophen (TYLENOL) 500 MG tablet Take 1,000 mg by mouth every 6 (six) hours as needed for mild pain or headache.       Marland Kitchen aspirin EC 81 MG tablet Take 81 mg by mouth daily.      Marland Kitchen atorvastatin (LIPITOR) 40 MG tablet Take 40 mg by mouth daily.      Marland Kitchen Hyoscyamine Sulfate (HYOSYNE PO) Take 0.375 mg by mouth daily as needed (stomach cramps).       Marland Kitchen ibuprofen (ADVIL,MOTRIN) 200 MG tablet Take 400 mg by mouth every 6 (six) hours as needed for mild pain or moderate pain.       . Multiple Vitamin (MULTIVITAMIN WITH MINERALS) TABS Take 1 tablet by mouth See admin instructions. Take 1 tablet Monday - Friday      . OVER THE COUNTER MEDICATION Take 2 tablets by mouth See admin instructions. Take 2 tablets Monday- Friday    Herbal Supplement with 10 different herbs.      . propranolol ER (INDERAL LA) 120 MG 24 hr capsule Take 120 mg by mouth daily.      . SUMAtriptan (IMITREX) 100 MG tablet Take 100 mg by mouth every 2 (two) hours as needed for migraine.           Review of Systems - General:Present- Feeling well. Not Present-  Fatigue, Fever and Night Sweats. Skin:Not Present- Itching and Rash. HEENT:Not Present- Headache. Respiratory:Not Present- Difficulty Breathing. Cardiovascular:Not Present- Claudications, Fainting, Orthopnea and Swelling of Extremities. Gastrointestinal:Not Present- Abdominal Pain, Constipation, Diarrhea, Nausea and Vomiting. Musculoskeletal:Present- Joint Pain (bilateral knee pain, walks with a cane). Not Present- Joint Swelling. Neurological:Present- Headaches. Hematology:Not Present- Blood Clots, Easy Bruising and Nose Bleed.  Blood pressure 134/75, pulse 82, temperature 97.2 F (36.2 C), temperature source Oral, resp. rate 18, height 5' (1.524 m), weight 68.947 kg (152 lb), SpO2 98.00%. General:Present- Feeling well.  Not Present- Fatigue, Fever and Night Sweats. Skin:Not Present- Itching and Rash. HEENT:Not Present- Headache. Respiratory:Not Present- Difficulty Breathing. Cardiovascular:Not Present- Claudications, Fainting, Orthopnea and Swelling of Extremities. Gastrointestinal:Not Present- Abdominal Pain, Constipation, Diarrhea, Nausea and Vomiting. Musculoskeletal:Present- Joint Pain (bilateral knee pain, walks with a cane). Not Present- Joint Swelling. Neurological:Present- Headaches. Hematology:Not Present- Blood Clots, Easy Bruising and Nose Bleed. Labs:   Lab Results  Component Value Date   WBC 7.8 07/15/2013   HGB 13.9 07/15/2013   HCT 42.3 07/15/2013   MCV 92.6 07/15/2013   PLT 246 07/15/2013   No results found for this basename: NA, K, CL, CO2, BUN, CREATININE, CALCIUM, LABALBU, PROT, BILITOT, ALKPHOS, ALT, AST, GLUCOSE,  in the last 168 hours Lab Results  Component Value Date   CKTOTAL 25 11/14/2007   CKMB 0.9 11/14/2007   TROPONINI  Value: 0.01        NO INDICATION OF MYOCARDIAL INJURY. 11/14/2007     EKG 09/03/2013: Sinus rhythmwith borderline short PR interval, P pulmonale, poor R-wave progression, cannot exclude anterior infarct old. Iinferior ST segment depression, lateral ST segment depression cannot exclude ischemia. Abnormal EKG. Assessment/Plan 1. Preoperative cardiovascular examination (V72.81)  2. Abnormal EKG (794.31)  Lexiscan sestamibi stress test 07/19/2013: 1. Resting EKG NSR, inferior infarct, anterior infarct old, nonspecific T-wave changes, low voltage complexes. Stress EKG was non diagnostic for ischemia. No ST-T changes of ischemia noted with pharmacologic stress testing. Resting BP was 122/84 and peak BP was 104/78 mm Hg. BP response was normal during stress procedure. Stress symptoms included lightheadedness. Stress terminated due to completion of protocol. 2. Perfusion imaging studies demonstrate a moderate size mild to moderate inferior and inferolateral  ischemia. The left ventricle and the stress images was dilated although the TID index does not reach significance at 1.2 Dynamic images reveal depressed left ventricle systolic function with global hypokinesis, the left ventricular ejection fraction was estimated to be 30%. This is a high risk study. Recommend further cardiac workup including cardiac catheterization if clinically indicated.  Echocardiogram 07/18/2013: 1. Left ventricular cavity is normal in size. Moderate concentric hypertrophy. Moderate septal notching. Diastolic filling with pseudonormal pattern and both elevated left-atrial and end-diastolic pressure. Normal global wall motion. Normal systolic global function. Calculated EF 56%. Doppler evidence of Grade II (pseudonormal) diastolic dysfunction. 2. Left atrial cavity is mildly dilated. Patent Foramen Ovale or a small secundum ASD present. Mild aneurysmal motion of the interatrial septum. 3. Moderate mitral regurgitation. Mild mitral valve leaflet thickening. Systolic anterior motion of the anterior mitral leaflet with resulting LVOT obstruction with a peak pressure gradient of > 25 mm Hg and moderate posteriorly directed MR. Consider hypertrophic cardiomyopathy in the differential diagnosis.  4. Hyperlipidemia (272.4) Labs 07/18/2013: Total cholesterol 241, triglycerides 196, HDL 48, LDL 154. LDL particle number severely elevated at 1921. LP-IR score moderately elevated at 58 suggest insulin resistance. TSH was normal. 5. 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 Patient presents to me for follow-up and evaluation of abnormal EKG. I reviewed the results of the echocardiogram which is just presence of what appears to be hypertrophic obstructive cardiomyopathy. Also the stress test was markedly abnormal with stress-induced LV dilatation, and evidence of myocardial ischemia, probably into  vascular to trace  For definitive diagnosis of coronary artery disease, I have recommended that he proceed with coronary angiography. I have started her on Inderal LA 120 mg by mouth daily both for possible CAD and also hyperdynamic left ventricle with HOCM physiology.  Due to markedly abnormal lipid status, I also started her on 40 mg of Lipitor by mouth daily.  Schedule for cardiac catheterization, and possible angioplasty. We discussed regarding risks, benefits, alternatives to this including stress testing, CTA and continued medical therapy. Patient wants to proceed. Understands <1-2% risk of death, stroke, MI, urgent CABG, bleeding, infection, renal failure but not limited to these. Labs 08/27/2013: Serum glucose 104 mg, BUN 12, serum creatinine 0.8. CBC within normal limits PT/PTT normal. Labs are stable for coronary angiography.  Pamella PertGANJI,JAGADEESH R, MD 09/03/2013, 6:34 AM [facility] Pager: [phone] Office: [phone] If no answer: Cell:  [phone]

## 2013-09-20 ENCOUNTER — Other Ambulatory Visit (HOSPITAL_COMMUNITY): Payer: Self-pay | Admitting: Cardiology

## 2013-09-20 DIAGNOSIS — I422 Other hypertrophic cardiomyopathy: Secondary | ICD-10-CM

## 2013-10-09 ENCOUNTER — Ambulatory Visit (HOSPITAL_COMMUNITY)
Admission: RE | Admit: 2013-10-09 | Discharge: 2013-10-09 | Disposition: A | Discharge status: Home / Self Care | Payer: BC Managed Care – PPO | Source: Ambulatory Visit | Attending: Cardiology | Admitting: Cardiology

## 2013-10-09 DIAGNOSIS — I422 Other hypertrophic cardiomyopathy: Secondary | ICD-10-CM

## 2013-10-09 DIAGNOSIS — I059 Rheumatic mitral valve disease, unspecified: Secondary | ICD-10-CM | POA: Insufficient documentation

## 2013-10-09 LAB — CREATININE, SERUM
CREATININE: 0.76 mg/dL (ref 0.50–1.10)
GFR calc Af Amer: >90 mL/min (ref >90)

## 2013-10-09 MED ORDER — GADOBENATE DIMEGLUMINE 529 MG/ML IV SOLN
22.0000 mL | Freq: Once | INTRAVENOUS | Status: AC | PRN
  Administered 2013-10-09: 22 mL via INTRAVENOUS

## 2013-11-05 ENCOUNTER — Other Ambulatory Visit: Payer: Self-pay | Admitting: Physician Assistant

## 2013-11-05 MED ORDER — DEXTROSE 5 % IV SOLN: 900.0000 mg | INTRAVENOUS | Status: DC

## 2013-11-05 NOTE — H&P (Signed)
TOTAL KNEE ADMISSION H&P  Patient is being admitted for left knee arthroscopy with uni vs total knee arthroplasty.  Subjective:  Chief Complaint:left knee pain.  HPI: Heidi Webster, 53 y.o. female, has a history of pain and functional disability in the left knee due to arthritis and has failed non-surgical conservative treatments for greater than 12 weeks to includeNSAID's and/or analgesics, corticosteriod injections, viscosupplementation injections, flexibility and strengthening excercises, supervised PT with diminished ADL's post treatment, use of assistive devices, weight reduction as appropriate and activity modification.  Onset of symptoms was gradual, starting 1 years ago with rapidlly worsening course since that time. The patient noted no past surgery on the left knee(s).  Patient currently rates pain in the left knee(s) at 10 out of 10 with activity. Patient has night pain, worsening of pain with activity and weight bearing, pain that interferes with activities of daily living, crepitus and joint swelling.  Patient has evidence of subchondral sclerosis, periarticular osteophytes and joint space narrowing by imaging studies.  There is no active infection.  Patient Active Problem List   Diagnosis Date Noted  . Diverticula of colon   . Kidney stone   . Left knee DJD   . Heart murmur   . Migraines   . Hx MRSA infection    Past Medical History  Diagnosis Date  . Migraines   . Hx MRSA infection 2009  . Diverticula of colon 2009  . Kidney stone 2013  . Left knee DJD   . Heart murmur     Past Surgical History  Procedure Laterality Date  . Colon surgery  2009    perforated Diverticula  . Shoulder capsulorrhaphy Right 1991     (Not in a hospital admission) Allergies  Allergen Reactions  . Augmentin [Amoxicillin-Pot Clavulanate] Rash  . Tape Rash    Current Outpatient Prescriptions on File Prior to Visit  Medication Sig Dispense Refill  . acetaminophen (TYLENOL) 500 MG tablet  Take 1,000 mg by mouth every 6 (six) hours as needed for mild pain or headache.       Marland Kitchen aspirin EC 81 MG tablet Take 81 mg by mouth daily.      Marland Kitchen atorvastatin (LIPITOR) 40 MG tablet Take 40 mg by mouth daily.      Marland Kitchen Hyoscyamine Sulfate (HYOSYNE PO) Take 0.375 mg by mouth daily as needed (stomach cramps).       Marland Kitchen ibuprofen (ADVIL,MOTRIN) 200 MG tablet Take 400 mg by mouth every 6 (six) hours as needed for mild pain or moderate pain.       . Multiple Vitamin (MULTIVITAMIN WITH MINERALS) TABS Take 1 tablet by mouth See admin instructions. Take 1 tablet Monday - Friday      . OVER THE COUNTER MEDICATION Take 2 tablets by mouth See admin instructions. Take 2 tablets Monday- Friday    Herbal Supplement with 10 different herbs.      . propranolol ER (INDERAL LA) 120 MG 24 hr capsule Take 120 mg by mouth daily.      . SUMAtriptan (IMITREX) 100 MG tablet Take 100 mg by mouth every 2 (two) hours as needed for migraine.        No current facility-administered medications on file prior to visit.    History  Substance Use Topics  . Smoking status: Never Smoker   . Smokeless tobacco: Not on file  . Alcohol Use: Yes     Comment: 1 drink a week    Family History  Problem Relation Age  of Onset  . Diabetes Mother   . Heart disease Mother   . Heart disease Father   . Arthritis-Osteo Father      Review of Systems  Constitutional: Negative.   HENT: Negative.   Eyes: Negative.   Respiratory: Negative.   Cardiovascular: Negative.   Gastrointestinal: Negative.   Genitourinary: Negative.   Musculoskeletal: Positive for back pain and joint pain.  Skin: Negative.   Neurological: Negative.   Endo/Heme/Allergies: Negative.   Psychiatric/Behavioral: Negative.     Objective:  Physical Exam  Constitutional: She is oriented to person, place, and time. She appears well-developed and well-nourished.  HENT:  Head: Normocephalic and atraumatic.  Eyes: Conjunctivae and EOM are normal. Pupils are equal,  round, and reactive to light.  Neck: Normal range of motion. Neck supple.  Cardiovascular: Normal rate and regular rhythm.  Exam reveals no gallop and no friction rub.   Murmur heard. Respiratory: Effort normal and breath sounds normal.  GI: Soft. Bowel sounds are normal.  Genitourinary:  Not pertinent to current symptomatology therefore not examined.  Musculoskeletal:  Examination of her left knee reveals pain on the medial joint line positive medial McMurray's 1+ effusion full range of motion knee is stable with normal patella tracking.   Neurological: She is alert and oriented to person, place, and time.  Skin: Skin is warm and dry.  Psychiatric: She has a normal mood and affect. Her behavior is normal.    Vital signs in last 24 hours: Last recorded: 03/10 1300   BP: 108/74 Pulse: 77  Temp: 98.5 F (36.9 C)    Height: 5' (1.524 m) SpO2: 98  Weight: 70.308 kg (155 lb)     Labs:   Estimated body mass index is 30.27 kg/(m^2) as calculated from the following:   Height as of this encounter: 5' (1.524 m).   Weight as of this encounter: 70.308 kg (155 lb).   Imaging Review Plain radiographs demonstrate severe degenerative joint disease of the left knee(s). The overall alignment issignificant varus. The bone quality appears to be good for age and reported activity level.  Assessment/Plan:  End stage arthritis, left knee   The patient history, physical examination, clinical judgment of the provider and imaging studies are consistent with end stage degenerative joint disease of the left knee(s) and uni vs total knee arthroplasty is deemed medically necessary. The treatment options including medical management, injection therapy arthroscopy and arthroplasty were discussed at length. The risks and benefits of total knee arthroplasty were presented and reviewed. The risks due to aseptic loosening, infection, stiffness, patella tracking problems, thromboembolic complications and other  imponderables were discussed. The patient acknowledged the explanation, agreed to proceed with the plan and consent was signed. Patient is being admitted for inpatient treatment for surgery, pain control, PT, OT, prophylactic antibiotics, VTE prophylaxis, progressive ambulation and ADL's and discharge planning. The patient is planning to be discharged home with home health services  Jwan Hornbaker A. Gwinda PasseShepperson, PA-C Physician Assistant [facility] [phone]  11/05/2013, 2:39 PM

## 2013-11-08 ENCOUNTER — Encounter (HOSPITAL_COMMUNITY): Payer: Self-pay | Admitting: Pharmacy Technician

## 2013-11-18 ENCOUNTER — Encounter (HOSPITAL_COMMUNITY): Payer: Self-pay

## 2013-11-18 ENCOUNTER — Encounter (HOSPITAL_COMMUNITY)
Admission: RE | Admit: 2013-11-18 | Discharge: 2013-11-18 | Disposition: A | Discharge status: Home / Self Care | Payer: BC Managed Care – PPO | Source: Ambulatory Visit | Attending: Orthopedic Surgery | Admitting: Orthopedic Surgery

## 2013-11-18 DIAGNOSIS — Z01812 Encounter for preprocedural laboratory examination: Secondary | ICD-10-CM | POA: Insufficient documentation

## 2013-11-18 HISTORY — DX: Other hypertrophic cardiomyopathy: I42.2

## 2013-11-18 HISTORY — DX: Adverse effect of unspecified anesthetic, initial encounter: T41.45XA

## 2013-11-18 HISTORY — DX: Personal history of urinary calculi: Z87.442

## 2013-11-18 HISTORY — DX: Cardiac arrhythmia, unspecified: I49.9

## 2013-11-18 HISTORY — DX: Other complications of anesthesia, initial encounter: T88.59XA

## 2013-11-18 HISTORY — DX: Gastro-esophageal reflux disease without esophagitis: K21.9

## 2013-11-18 LAB — URINALYSIS, ROUTINE W REFLEX MICROSCOPIC
Bilirubin Urine: NEGATIVE
Glucose, UA: NEGATIVE mg/dL
Hgb urine dipstick: NEGATIVE
Ketones, ur: NEGATIVE mg/dL
NITRITE: NEGATIVE
Protein, ur: NEGATIVE mg/dL
SPECIFIC GRAVITY, URINE: 1.026 (ref 1.005–1.030)
UROBILINOGEN UA: 0.2 mg/dL (ref 0.0–1.0)
pH: 5.5 (ref 5.0–8.0)

## 2013-11-18 LAB — PROTIME-INR
INR: 1.02 (ref 0.00–1.49)
Prothrombin Time: 13.2 seconds (ref 11.6–15.2)

## 2013-11-18 LAB — CBC WITH DIFFERENTIAL/PLATELET
BASOS ABS: 0 10*3/uL (ref 0.0–0.1)
BASOS PCT: 0 % (ref 0–1)
EOS ABS: 0.1 10*3/uL (ref 0.0–0.7)
Eosinophils Relative: 1 % (ref 0–5)
HCT: 41.6 % (ref 36.0–46.0)
Hemoglobin: 14.2 g/dL (ref 12.0–15.0)
Lymphocytes Relative: 15 % (ref 12–46)
Lymphs Abs: 1.5 10*3/uL (ref 0.7–4.0)
MCH: 32.3 pg (ref 26.0–34.0)
MCHC: 34.1 g/dL (ref 30.0–36.0)
MCV: 94.8 fL (ref 78.0–100.0)
Monocytes Absolute: 0.6 10*3/uL (ref 0.1–1.0)
Monocytes Relative: 7 % (ref 3–12)
Neutro Abs: 7.6 10*3/uL (ref 1.7–7.7)
Neutrophils Relative %: 77 % (ref 43–77)
Platelets: 232 10*3/uL (ref 150–400)
RBC: 4.39 MIL/uL (ref 3.87–5.11)
RDW: 13.5 % (ref 11.5–15.5)
WBC: 9.9 10*3/uL (ref 4.0–10.5)

## 2013-11-18 LAB — URINE MICROSCOPIC-ADD ON

## 2013-11-18 LAB — TYPE AND SCREEN
ABO/RH(D): O POS
Antibody Screen: NEGATIVE

## 2013-11-18 LAB — COMPREHENSIVE METABOLIC PANEL
ALK PHOS: 100 U/L (ref 39–117)
ALT: 23 U/L (ref 0–35)
AST: 22 U/L (ref 0–37)
Albumin: 3.9 g/dL (ref 3.5–5.2)
BUN: 16 mg/dL (ref 6–23)
CO2: 24 mEq/L (ref 19–32)
Calcium: 9.5 mg/dL (ref 8.4–10.5)
Chloride: 105 mEq/L (ref 96–112)
Creatinine, Ser: 0.77 mg/dL (ref 0.50–1.10)
GFR calc Af Amer: >90 mL/min (ref >90)
GFR calc non Af Amer: >90 mL/min (ref >90)
Glucose, Bld: 91 mg/dL (ref 70–99)
POTASSIUM: 4.5 meq/L (ref 3.7–5.3)
Sodium: 141 mEq/L (ref 137–147)
Total Bilirubin: 0.5 mg/dL (ref 0.3–1.2)
Total Protein: 7 g/dL (ref 6.0–8.3)

## 2013-11-18 LAB — SURGICAL PCR SCREEN
MRSA, PCR: POSITIVE — AB
Staphylococcus aureus: POSITIVE — AB

## 2013-11-18 LAB — APTT: aPTT: 30 seconds (ref 24–37)

## 2013-11-18 NOTE — Pre-Procedure Instructions (Addendum)
Heidi Webster  11/18/2013   Your procedure is scheduled on:  11/25/13  Report to [facility] short stay admitting at 530 AM.  Call this number if you have problems the morning of surgery: [phone]   Remember:   Do not eat food or drink liquids after midnight.   Take these medicines the morning of surgery with A SIP OF WATER: hyoscyamine,,imitrex if needed         STOP all herbel meds, nsaids (aleve,naproxen,advil,ibuprofen) 5 days prior to surgery 11/20/13  including vitamins,aspirin,coq10   Do not wear jewelry, make-up or nail polish.  Do not wear lotions, powders, or perfumes. You may wear deodorant.  Do not shave 48 hours prior to surgery. Men may shave face and neck.  Do not bring valuables to the hospital.  [facility] is not responsible                  for any belongings or valuables.               Contacts, dentures or bridgework may not be worn into surgery.  Leave suitcase in the car. After surgery it may be brought to your room.  For patients admitted to the hospital, discharge time is determined by your                treatment team.               Patients discharged the day of surgery will not be allowed to drive  home.  Name and phone number of your driver:   Special Instructions:  Special Instructions: [place] - Preparing for Surgery  Before surgery, you can play an important role.  Because skin is not sterile, your skin needs to be as free of germs as possible.  You can reduce the number of germs on you skin by washing with CHG (chlorahexidine gluconate) soap before surgery.  CHG is an antiseptic cleaner which kills germs and bonds with the skin to continue killing germs even after washing.  Please DO NOT use if you have an allergy to CHG or antibacterial soaps.  If your skin becomes reddened/irritated stop using the CHG and inform your nurse when you arrive at Short Stay.  Do not shave (including legs and underarms) for at least 48 hours prior to the first CHG  shower.  You may shave your face.  Please follow these instructions carefully:   1.  Shower with CHG Soap the night before surgery and the morning of Surgery.  2.  If you choose to wash your hair, wash your hair first as usual with your normal shampoo.  3.  After you shampoo, rinse your hair and body thoroughly to remove the Shampoo.  4.  Use CHG as you would any other liquid soap.  You can apply chg directly  to the skin and wash gently with scrungie or a clean washcloth.  5.  Apply the CHG Soap to your body ONLY FROM THE NECK DOWN.  Do not use on open wounds or open sores.  Avoid contact with your eyes ears, mouth and genitals (private parts).  Wash genitals (private parts)       with your normal soap.  6.  Wash thoroughly, paying special attention to the area where your surgery will be performed.  7.  Thoroughly rinse your body with warm water from the neck down.  8.  DO NOT shower/wash with your normal soap after using and rinsing off the  CHG Soap.  9.  Pat yourself dry with a clean towel.            10.  Wear clean pajamas.            11.  Place clean sheets on your bed the night of your first shower and do not sleep with pets.  Day of Surgery  Do not apply any lotions/deodorants the morning of surgery.  Please wear clean clothes to the hospital/surgery center.   Please read over the following fact sheets that you were given: Pain Booklet, Coughing and Deep Breathing, Blood Transfusion Information, Total Joint Packet, MRSA Information and Surgical Site Infection Prevention

## 2013-11-18 NOTE — Progress Notes (Signed)
req'd stress,echo,notes from dr Jacinto Halim

## 2013-11-19 ENCOUNTER — Encounter (HOSPITAL_COMMUNITY): Payer: Self-pay

## 2013-11-19 ENCOUNTER — Encounter (HOSPITAL_COMMUNITY): Payer: Self-pay | Admitting: Vascular Surgery

## 2013-11-19 NOTE — Progress Notes (Signed)
Anesthesia Chart Review: Patient is a 53 year old female scheduled for left TKR on 11/25/13 by by Dr. Thurston Hole. Surgery was initially scheduled for 06/2013, but was postponed due to need for further cardiac evaluation.  History includes non-smoker, migraines, nephrolithiasis, heart murmur, hypertrophic obstructive cardiomyopathy, tachycardia, sigmoid colon resection due to diverticulitis with abscess 03/2008, MRSA '09. PCP is listed as Dr. Catalina Pizza. Cardiologist is Dr. Jacinto Halim. He cleared her for surgery, but recommended "keeping her fluid status well during perioperative times."   EKG on 09/03/13 showed NSR, biatrial enlargement, inferior infarct (age undetermined), possible anterior infarct (age undetermined). Lateral ST/T wave abnormality, consider ischemia, prolonged QT.   Cardiac MRI on 10/09/13 showed: 1. Today's study demonstrates definitive evidence of hypertrophic obstructive cardiomyopathy (HOCM). Specifically, there is marked asymmetric septal hypertrophy, evidence of infiltrative cardiomyopathy on delayed enhancement imaging, dynamic outflow tract obstruction which is severely worsened by [facility] resulting in a peak systolic gradient of greater than 121 mmHg in the left ventricular outflow tract as well as moderate mitral regurgitation.  2. Normal resting left ventricular wall motion and function with calculated ejection fraction of 60% percent.  3. Mild left atrial dilatation.  Cardiac cath on 09/03/13 showed: Hemodynamic data:  Left ventricular apex pressure was 170/19 with end-diastolic pressure of 25 mm mercury. Left ventricle are base pressure was 94/14 with LVEDP of 21 mm mercury. There was a peak to peak pressure intraventricular pressure gradient of 76 mmHg. Aortic pressure was 99/65 with a mean of 83 mm mercury. There was no pressure gradient across the aortic valve. The findings are suggestive of intraventricular pressure gradient and post PVC elevation and pressure gradient suggest LVOT  obstruction.  Left ventricle: Performed in the RAO projection revealed LVEF of 65-70%. There was No MR. No wall motion abnormality.  Right coronary artery: The vessel is smooth, normal, Dominant. Large PDA and large PL branches.  Left main coronary artery is large and normal.  Circumflex coronary artery: A large vessel giving origin to a large obtuse marginal 1. It is smooth and normal.  Ramus intermediate: Small to moderate vessel, smooth and normal.  LAD: LAD gives origin to a large diagonal-1. LAD has mild luminal irregularities. Ostial LAD has eccentric 20% stenosis. Ostial diagonal have minimal disease.  Rec: Medical therapy with aggressive risk factor reduction. Patient can undergo surgery with acceptable cardiovascular risk. Patient scheduled for knee replacement. She will need to avoid hypotension. 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  Echocardiogram on 07/18/2013 [facility]) showed left ventricular cavity is normal in size. Moderate concentric hypertrophy. Moderate septal notching. Diastolic filling with pseudonormal pattern and both elevated left atrial and end-diastolic pressure. Normal global wall motion. Normal systolic global function. Calculated EF 56%. Doppler evidence of grade 2 (pseudonormal) diastolic dysfunction. Left atrial cavity is mildly dilated. Patent foramen ovale or small secundum ASD is present. Mild aneurysmal motion of the intra-atrial septum. Moderate mitral regurgitation. Mild mitral valve leaflet thickening. Systolic anterior motion of the anterior mitral leaflet with resulting LVOT obstruction with a peak pressure gradient of greater than 25 mmHg and moderate posteriorly directed MR. Consider hypertropic cardiomyopathy in the differential diagnosis.  Nuclear stress test on 07/19/13 [facility]) showed: Perfusion imaging studies demonstrate a moderate sized mild to moderate inferior and inferolateral ischemia. Left ventricle and the  stress images was dilated although the TID index did not reach significance at 1.2. Dynamic images revealed depressed left ventricle systolic function with global hypokinesis; left ventricular ejection fraction was estimated to be 30%.  This is a high risk study. Recommend further cardiac workup including cardiac catheterization if clinically indicated.  CXR on 07/15/13 showed no acute cardiopulmonary disease.  Preoperative labs noted. Urine culture is pending.  Patient with recent extensive cardiac testing and diagnosed with HCM.  She has been cleared by her cardiologist but will need close attention to fluid status.  If no acute changes then I anticipate that she can proceed as planned.  Heidi Ochsllison Maygen Sirico, PA-C [facility]hesiology Phone [phone] 11/19/2013 11:24 AM

## 2013-11-20 LAB — URINE CULTURE

## 2013-11-25 ENCOUNTER — Inpatient Hospital Stay (HOSPITAL_COMMUNITY)
Admission: RE | Admit: 2013-11-25 | Payer: BC Managed Care – PPO | Source: Ambulatory Visit | Admitting: Orthopedic Surgery

## 2013-11-25 ENCOUNTER — Encounter (HOSPITAL_COMMUNITY): Admission: RE | Payer: Self-pay | Source: Ambulatory Visit

## 2013-11-25 SURGERY — ARTHROPLASTY, KNEE, UNICOMPARTMENTAL
Anesthesia: General | Laterality: Left

## 2013-11-27 ENCOUNTER — Ambulatory Visit (INDEPENDENT_AMBULATORY_CARE_PROVIDER_SITE_OTHER): Payer: BC Managed Care – PPO | Admitting: Infectious Diseases

## 2013-11-27 ENCOUNTER — Encounter: Payer: Self-pay | Admitting: Infectious Diseases

## 2013-11-27 VITALS — BP 126/83 | HR 69 | Temp 98.2°F | Ht 60.0 in | Wt 155.0 lb

## 2013-11-27 DIAGNOSIS — Z8614 Personal history of Methicillin resistant Staphylococcus aureus infection: Secondary | ICD-10-CM

## 2013-11-27 NOTE — Progress Notes (Signed)
   Subjective:    Patient ID: Heidi Webster, female    DOB: 05/05/1961, 53 y.o.   MRN: [id number]  HPI 53 yo F with hx of HCOM, DJD and planning for B TKR. She had UCx that showed MRSA as well as screening Cx+ for MRSA.  Was started on doxy for last 6 days. Has been on intranasal mupirocin for the last 6 days as well. Has been using surgical scrub bid for the last 3 weeks as well.  Her L TKR has been delayed for 3-6 months at this point.   Colonic perforation 2009 post apendectomy.   Works as a PT.   Review of Systems  Constitutional: Negative for fever, chills, appetite change and unexpected weight change.  Gastrointestinal: Negative for diarrhea and constipation.  Endocrine: Negative for polyuria.  Genitourinary: Positive for urgency. Negative for dysuria and difficulty urinating.       Objective:   Physical Exam  Constitutional: She appears well-developed and well-nourished.  HENT:  Mouth/Throat: No oropharyngeal exudate.  Eyes: EOM are normal. Pupils are equal, round, and reactive to light.  Neck: Neck supple.  Cardiovascular: Normal rate, regular rhythm and normal heart sounds.   Pulmonary/Chest: Effort normal and breath sounds normal.  Abdominal: Soft. Bowel sounds are normal. She exhibits no distension. There is no tenderness.  Musculoskeletal:  Knees are non-swollen, non-tender, no erythema.   Lymphadenopathy:    She has no cervical adenopathy.          Assessment & Plan:

## 2013-11-27 NOTE — Assessment & Plan Note (Signed)
Would change hibiclens to weekly until prior to surgery (daily for 5 days prior) Would change bactroban to first 5 days of each month until surgery (then 5 days prior) Would anticipate her surgery in 3-6 months. I am not sure that a longer delay will be more helpful.  She should get vanoc (as noted in ortho notes) peri-operativley.  Would not recheck/rescreen her between now and surgery unless she is symptomatic.  As far as protecting herself and her family members, wearing gloves at work, hand washing, "sanicloths" would be helpful as she is doing. Would rec washing clothes in hot water. Use bath soap which is "antibacterial".  Complete doxy for her UTI, re-culture her only if she has sx.

## 2013-11-27 NOTE — Progress Notes (Signed)
   Subjective:    Patient ID: Heidi Webster, female    DOB: Mar 22, 1961, 53 y.o.   MRN: [id number]  HPI    Review of Systems     Objective:   Physical Exam  Cardiovascular:  Murmur heard.         Assessment & Plan:

## 2014-02-26 ENCOUNTER — Other Ambulatory Visit: Payer: Self-pay | Admitting: Physician Assistant

## 2014-02-26 ENCOUNTER — Encounter: Payer: Self-pay | Admitting: Physician Assistant

## 2014-02-26 ENCOUNTER — Encounter (HOSPITAL_COMMUNITY): Payer: Self-pay | Admitting: Pharmacy Technician

## 2014-02-26 DIAGNOSIS — T4145XD Adverse effect of unspecified anesthetic, subsequent encounter: Secondary | ICD-10-CM

## 2014-02-26 DIAGNOSIS — T8859XA Other complications of anesthesia, initial encounter: Secondary | ICD-10-CM | POA: Insufficient documentation

## 2014-02-26 DIAGNOSIS — T8859XD Other complications of anesthesia, subsequent encounter: Secondary | ICD-10-CM

## 2014-02-26 DIAGNOSIS — T4145XA Adverse effect of unspecified anesthetic, initial encounter: Secondary | ICD-10-CM | POA: Insufficient documentation

## 2014-02-26 DIAGNOSIS — K219 Gastro-esophageal reflux disease without esophagitis: Secondary | ICD-10-CM | POA: Insufficient documentation

## 2014-02-26 DIAGNOSIS — K2100 Gastro-esophageal reflux disease with esophagitis, without bleeding: Secondary | ICD-10-CM

## 2014-02-26 DIAGNOSIS — I499 Cardiac arrhythmia, unspecified: Secondary | ICD-10-CM | POA: Insufficient documentation

## 2014-02-26 DIAGNOSIS — I422 Other hypertrophic cardiomyopathy: Secondary | ICD-10-CM | POA: Insufficient documentation

## 2014-02-26 NOTE — H&P (Signed)
TOTAL KNEE ADMISSION H&P  Patient is being admitted for left total knee arthroplasty.  Subjective:  Chief Complaint:left knee pain.  HPI: Heidi Webster, 53 y.o. female, has a history of pain and functional disability in the left knee due to arthritis and has failed non-surgical conservative treatments for greater than 12 weeks to includeNSAID's and/or analgesics, corticosteriod injections, viscosupplementation injections, flexibility and strengthening excercises, supervised PT with diminished ADL's post treatment, use of assistive devices, weight reduction as appropriate and activity modification.  Onset of symptoms was gradual, starting 10 years ago with gradually worsening course since that time. The patient noted no past surgery on the right knee(s).  Patient currently rates pain in the right knee(s) at 10 out of 10 with activity. Patient has night pain, worsening of pain with activity and weight bearing, pain that interferes with activities of daily living, crepitus and joint swelling.  Patient has evidence of subchondral sclerosis, periarticular osteophytes and joint space narrowing by imaging studies.  There is no active infection.  Patient Active Problem List   Diagnosis Date Noted  . Cardiomyopathy, hypertrophic   . GERD (gastroesophageal reflux disease)   . Dysrhythmia   . Complication of anesthesia   . Diverticula of colon   . Kidney stone   . Left knee DJD   . Heart murmur   . Migraines   . Hx MRSA infection    Past Medical History  Diagnosis Date  . Migraines   . Hx MRSA infection 2009  . Diverticula of colon 2009  . Left knee DJD   . Heart murmur   . Complication of anesthesia     slow to wake up  . Dysrhythmia     tachycardia  . History of kidney stones   . GERD (gastroesophageal reflux disease)     occ   . Cardiomyopathy, hypertrophic     hypertrophic obstructive cardiomyopathy (Dr. Jacinto HalimGanji)    Past Surgical History  Procedure Laterality Date  . Colon surgery   2009    perforated Diverticula  . Shoulder capsulorrhaphy Right 1991  . Cardiac catheterization  1/15     (Not in a hospital admission) Allergies  Allergen Reactions  . Augmentin [Amoxicillin-Pot Clavulanate] Rash  . Tape Rash    bandaides    History  Substance Use Topics  . Smoking status: Never Smoker   . Smokeless tobacco: Never Used  . Alcohol Use: Yes     Comment: 1 drink a week    Family History  Problem Relation Age of Onset  . Diabetes Mother   . Heart disease Mother     murmur and cardiomyopathy  . Heart disease Father   . Arthritis-Osteo Father      Review of Systems  Constitutional: Negative.   HENT: Negative.   Eyes: Negative.   Respiratory: Negative.   Cardiovascular: Negative.   Gastrointestinal: Negative.   Genitourinary: Negative.   Musculoskeletal: Positive for back pain and joint pain.  Skin: Negative.   Neurological: Negative.   Endo/Heme/Allergies: Negative.   Psychiatric/Behavioral: Negative.     Objective:  Physical Exam  Constitutional: She is oriented to person, place, and time. She appears well-developed and well-nourished.  HENT:  Head: Normocephalic and atraumatic.  Mouth/Throat: Oropharynx is clear and moist.  Eyes: Pupils are equal, round, and reactive to light.  Neck: Neck supple.  Cardiovascular: Normal rate and regular rhythm.   Murmur heard. Respiratory: Effort normal and breath sounds normal.  GI: Soft. Bowel sounds are normal.  Genitourinary:  Not  pertinent to current symptomatology therefore not examined.  Musculoskeletal:  She ambulates with moderate antalgic gait varus deformity 2+ crepitus active range of motion -5 to 120 degrees bilaterally. Distal neurovascular exam is intact.  Neurological: She is alert and oriented to person, place, and time.  Skin: Skin is warm and dry.  Psychiatric: She has a normal mood and affect. Her behavior is normal.    Vital signs in last 24 hours: Last recorded: 07/01 1300   BP:  110/73 Pulse: 98  Temp: 98.2 F (36.8 C)    Height: 5' (1.524 m) SpO2: 96  Weight: 70.761 kg (156 lb)     Labs:   Estimated body mass index is 30.47 kg/(m^2) as calculated from the following:   Height as of this encounter: 5' (1.524 m).   Weight as of this encounter: 70.761 kg (156 lb).   Imaging Review Plain radiographs demonstrate severe degenerative joint disease of the bilaterally knee(s). The overall alignment issignificant varus. The bone quality appears to be good for age and reported activity level.  Assessment/Plan:  End stage arthritis, left knee   The patient history, physical examination, clinical judgment of the provider and imaging studies are consistent with end stage degenerative joint disease of the left knee(s) and total knee arthroplasty is deemed medically necessary. The treatment options including medical management, injection therapy arthroscopy and arthroplasty were discussed at length. The risks and benefits of total knee arthroplasty were presented and reviewed. The risks due to aseptic loosening, infection, stiffness, patella tracking problems, thromboembolic complications and other imponderables were discussed. The patient acknowledged the explanation, agreed to proceed with the plan and consent was signed. Patient is being admitted for inpatient treatment for surgery, pain control, PT, OT, prophylactic antibiotics, VTE prophylaxis, progressive ambulation and ADL's and discharge planning. The patient is planning to be discharged home with home health services  Heidi Webster A. Gwinda Passe Physician Assistant [facility] [phone]  02/26/2014, 2:56 PM

## 2014-02-27 NOTE — H&P (Signed)
TOTAL KNEE ADMISSION H&P  Patient is being admitted for left total knee arthroplasty.  Subjective:  Chief Complaint:left knee pain.  HPI: Heidi Webster, 53 y.o. female, has a history of pain and functional disability in the left knee due to arthritis and has failed non-surgical conservative treatments for greater than 12 weeks to includeNSAID's and/or analgesics, corticosteriod injections, viscosupplementation injections, flexibility and strengthening excercises, supervised PT with diminished ADL's post treatment, use of assistive devices, weight reduction as appropriate and activity modification. Onset of symptoms was gradual, starting 10 years ago with gradually worsening course since that time. The patient noted no past surgery on the right knee(s). Patient currently rates pain in the right knee(s) at 10 out of 10 with activity. Patient has night pain, worsening of pain with activity and weight bearing, pain that interferes with activities of daily living, crepitus and joint swelling. Patient has evidence of subchondral sclerosis, periarticular osteophytes and joint space narrowing by imaging studies. There is no active infection.  Patient Active Problem List    Diagnosis  Date Noted   .  Cardiomyopathy, hypertrophic    .  GERD (gastroesophageal reflux disease)    .  Dysrhythmia    .  Complication of anesthesia    .  Diverticula of colon    .  Kidney stone    .  Left knee DJD    .  Heart murmur    .  Migraines    .  Hx MRSA infection     Past Medical History   Diagnosis  Date   .  Migraines    .  Hx MRSA infection  2009   .  Diverticula of colon  2009   .  Left knee DJD    .  Heart murmur    .  Complication of anesthesia      slow to wake up   .  Dysrhythmia      tachycardia   .  History of kidney stones    .  GERD (gastroesophageal reflux disease)      occ   .  Cardiomyopathy, hypertrophic      hypertrophic obstructive cardiomyopathy (Dr. Jacinto HalimGanji)    Past Surgical History    Procedure  Laterality  Date   .  Colon surgery   2009     perforated Diverticula   .  Shoulder capsulorrhaphy  Right  1991   .  Cardiac catheterization   1/15     (Not in a hospital admission)  Allergies   Allergen  Reactions   .  Augmentin [Amoxicillin-Pot Clavulanate]  Rash   .  Tape  Rash     bandaides    History   Substance Use Topics   .  Smoking status:  Never Smoker   .  Smokeless tobacco:  Never Used   .  Alcohol Use:  Yes      Comment: 1 drink a week    Family History   Problem  Relation  Age of Onset   .  Diabetes  Mother    .  Heart disease  Mother      murmur and cardiomyopathy   .  Heart disease  Father    .  Arthritis-Osteo  Father     Review of Systems  Constitutional: Negative.  HENT: Negative.  Eyes: Negative.  Respiratory: Negative.  Cardiovascular: Negative.  Gastrointestinal: Negative.  Genitourinary: Negative.  Musculoskeletal: Positive for back pain and joint pain.  Skin: Negative.  Neurological: Negative.  Endo/Heme/Allergies: Negative.  Psychiatric/Behavioral: Negative.   Objective:  Physical Exam  Constitutional: She is oriented to person, place, and time. She appears well-developed and well-nourished.  HENT:  Head: Normocephalic and atraumatic.  Mouth/Throat: Oropharynx is clear and moist.  Eyes: Pupils are equal, round, and reactive to light.  Neck: Neck supple.  Cardiovascular: Normal rate and regular rhythm.  Murmur heard.  Respiratory: Effort normal and breath sounds normal.  GI: Soft. Bowel sounds are normal.  Genitourinary:  Not pertinent to current symptomatology therefore not examined.  Musculoskeletal:  She ambulates with moderate antalgic gait varus deformity 2+ crepitus active range of motion -5 to 120 degrees bilaterally. Distal neurovascular exam is intact.  Neurological: She is alert and oriented to person, place, and time.  Skin: Skin is warm and dry.  Psychiatric: She has a normal mood and affect. Her behavior is  normal.   Vital signs in last 24 hours:  Last recorded: 07/01 1300    BP:  110/73  Pulse:  98   Temp:  98.2 F (36.8 C)     Height:  5' (1.524 m)  SpO2:  96   Weight:  70.761 kg (156 lb)   Labs:  Estimated body mass index is 30.47 kg/(m^2) as calculated from the following:  Height as of this encounter: 5' (1.524 m).  Weight as of this encounter: 70.761 kg (156 lb).  Imaging Review  Plain radiographs demonstrate severe degenerative joint disease of the bilaterally knee(s). The overall alignment issignificant varus. The bone quality appears to be good for age and reported activity level.  Assessment/Plan:  End stage arthritis, left knee  Patient Active Problem List   Diagnosis Date Noted  . Cardiomyopathy, hypertrophic   . GERD (gastroesophageal reflux disease)   . Dysrhythmia   . Complication of anesthesia   . Diverticula of colon   . Kidney stone   . Left knee DJD   . Heart murmur   . Migraines   . Hx MRSA infection    The patient history, physical examination, clinical judgment of the provider and imaging studies are consistent with end stage degenerative joint disease of the left knee(s) and total knee arthroplasty is deemed medically necessary. The treatment options including medical management, injection therapy arthroscopy and arthroplasty were discussed at length. The risks and benefits of total knee arthroplasty were presented and reviewed. The risks due to aseptic loosening, infection, stiffness, patella tracking problems, thromboembolic complications and other imponderables were discussed. The patient acknowledged the explanation, agreed to proceed with the plan and consent was signed. Patient is being admitted for inpatient treatment for surgery, pain control, PT, OT, prophylactic antibiotics, VTE prophylaxis, progressive ambulation and ADL's and discharge planning. The patient is planning to be discharged home with home health services.  This patient is getting Ancef and  Vancomycin perioperatively and has done MRSA decontamination protocol per Infectious disease.  Claryssa Sandner A. Gwinda Passe  Physician Assistant  [facility]  [phone]  02/26/2014, 2:56 PM

## 2014-03-03 ENCOUNTER — Encounter (HOSPITAL_COMMUNITY)
Admission: RE | Admit: 2014-03-03 | Discharge: 2014-03-03 | Disposition: A | Discharge status: Home / Self Care | Payer: BC Managed Care – PPO | Source: Ambulatory Visit | Attending: Orthopedic Surgery | Admitting: Orthopedic Surgery

## 2014-03-03 ENCOUNTER — Encounter (HOSPITAL_COMMUNITY): Payer: Self-pay

## 2014-03-03 DIAGNOSIS — Z01818 Encounter for other preprocedural examination: Secondary | ICD-10-CM | POA: Insufficient documentation

## 2014-03-03 DIAGNOSIS — Z01812 Encounter for preprocedural laboratory examination: Secondary | ICD-10-CM | POA: Insufficient documentation

## 2014-03-03 HISTORY — DX: Family history of other specified conditions: Z84.89

## 2014-03-03 LAB — CBC WITH DIFFERENTIAL/PLATELET
Basophils Absolute: 0 10*3/uL (ref 0.0–0.1)
Basophils Relative: 0 % (ref 0–1)
Eosinophils Absolute: 0.1 10*3/uL (ref 0.0–0.7)
Eosinophils Relative: 1 % (ref 0–5)
HCT: 41.1 % (ref 36.0–46.0)
HEMOGLOBIN: 13.4 g/dL (ref 12.0–15.0)
LYMPHS ABS: 1.7 10*3/uL (ref 0.7–4.0)
LYMPHS PCT: 25 % (ref 12–46)
MCH: 30.9 pg (ref 26.0–34.0)
MCHC: 32.6 g/dL (ref 30.0–36.0)
MCV: 94.9 fL (ref 78.0–100.0)
MONOS PCT: 6 % (ref 3–12)
Monocytes Absolute: 0.4 10*3/uL (ref 0.1–1.0)
NEUTROS ABS: 4.6 10*3/uL (ref 1.7–7.7)
NEUTROS PCT: 68 % (ref 43–77)
Platelets: 241 10*3/uL (ref 150–400)
RBC: 4.33 MIL/uL (ref 3.87–5.11)
RDW: 12.8 % (ref 11.5–15.5)
WBC: 6.7 10*3/uL (ref 4.0–10.5)

## 2014-03-03 LAB — COMPREHENSIVE METABOLIC PANEL
ALBUMIN: 4.2 g/dL (ref 3.5–5.2)
ALT: 28 U/L (ref 0–35)
ANION GAP: 13 (ref 5–15)
AST: 22 U/L (ref 0–37)
Alkaline Phosphatase: 84 U/L (ref 39–117)
BILIRUBIN TOTAL: 0.5 mg/dL (ref 0.3–1.2)
BUN: 13 mg/dL (ref 6–23)
CHLORIDE: 105 meq/L (ref 96–112)
CO2: 24 mEq/L (ref 19–32)
Calcium: 9.6 mg/dL (ref 8.4–10.5)
Creatinine, Ser: 0.7 mg/dL (ref 0.50–1.10)
GFR calc non Af Amer: >90 mL/min (ref >90)
GLUCOSE: 98 mg/dL (ref 70–99)
POTASSIUM: 4.4 meq/L (ref 3.7–5.3)
Sodium: 142 mEq/L (ref 137–147)
Total Protein: 7.2 g/dL (ref 6.0–8.3)

## 2014-03-03 LAB — URINALYSIS, ROUTINE W REFLEX MICROSCOPIC
Bilirubin Urine: NEGATIVE
Glucose, UA: NEGATIVE mg/dL
HGB URINE DIPSTICK: NEGATIVE
Ketones, ur: NEGATIVE mg/dL
Nitrite: NEGATIVE
Protein, ur: NEGATIVE mg/dL
Specific Gravity, Urine: 1.013 (ref 1.005–1.030)
Urobilinogen, UA: 0.2 mg/dL (ref 0.0–1.0)
pH: 6.5 (ref 5.0–8.0)

## 2014-03-03 LAB — SURGICAL PCR SCREEN
MRSA, PCR: NEGATIVE
Staphylococcus aureus: NEGATIVE

## 2014-03-03 LAB — HCG, SERUM, QUALITATIVE: PREG SERUM: NEGATIVE

## 2014-03-03 LAB — URINE MICROSCOPIC-ADD ON

## 2014-03-03 LAB — TYPE AND SCREEN
ABO/RH(D): O POS
ANTIBODY SCREEN: NEGATIVE

## 2014-03-03 LAB — PROTIME-INR
INR: 1.01 (ref 0.00–1.49)
Prothrombin Time: 13.3 seconds (ref 11.6–15.2)

## 2014-03-03 LAB — APTT: APTT: 31 s (ref 24–37)

## 2014-03-03 MED ORDER — CHLORHEXIDINE GLUCONATE 4 % EX LIQD: 60.0000 mL | Freq: Once | CUTANEOUS | Status: DC

## 2014-03-03 NOTE — Pre-Procedure Instructions (Signed)
Heidi Webster  03/03/2014   Your procedure is scheduled on:  Monday July 13 th 0715 AM  Report to [facility] Admitting at 0530 AM.  Call this number if you have problems the morning of surgery: [phone]   Remember:   Do not eat food or drink liquids after midnight.   Take these medicines the morning of surgery with A SIP OF WATER: Claritin, Levbid if needed for cramping, Inderal ,  Imitrex if needed for migraine and Tylenol if needed for pain. Stop Aspirin, Co enzyme Q 10, Ibuprofen, Multivitamins, and herbal meds 5 days prior to surgery  Do not wear jewelry, make-up or nail polish.  Do not wear lotions, powders, or perfumes. You may wear deodorant.  Do not shave 48 hours prior to surgery.   Do not bring valuables to the hospital.  [facility] is not responsible for any belongings or valuables.               Contacts, dentures or bridgework may not be worn into surgery.  Leave suitcase in the car. After surgery it may be brought to your room.  For patients admitted to the hospital, discharge time is determined by your                treatment team.               Patients discharged the day of surgery will not be allowed to drive home.    Special Instructions: [facility] - Preparing for Surgery  Before surgery, you can play an important role.  Because skin is not sterile, your skin needs to be as free of germs as possible.  You can reduce the number of germs on you skin by washing with CHG (chlorahexidine gluconate) soap before surgery.  CHG is an antiseptic cleaner which kills germs and bonds with the skin to continue killing germs even after washing.  Please DO NOT use if you have an allergy to CHG or antibacterial soaps.  If your skin becomes reddened/irritated stop using the CHG and inform your nurse when you arrive at Short Stay.  Do not shave (including legs and underarms) for at least 48 hours prior to the first CHG shower.  You may shave your face.  Please follow  these instructions carefully:   1.  Shower with CHG Soap the night before surgery and the                                morning of Surgery.  2.  If you choose to wash your hair, wash your hair first as usual with your       normal shampoo.  3.  After you shampoo, rinse your hair and body thoroughly to remove the                      Shampoo.  4.  Use CHG as you would any other liquid soap.  You can apply chg directly       to the skin and wash gently with scrungie or a clean washcloth.  5.  Apply the CHG Soap to your body ONLY FROM THE NECK DOWN.        Do not use on open wounds or open sores.  Avoid contact with your eyes,       ears, mouth and genitals (private parts).  Wash genitals (private parts)  with your normal soap.  6.  Wash thoroughly, paying special attention to the area where your surgery        will be performed.  7.  Thoroughly rinse your body with warm water from the neck down.  8.  DO NOT shower/wash with your normal soap after using and rinsing off       the CHG Soap.  9.  Pat yourself dry with a clean towel.            10.  Wear clean pajamas.            11.  Place clean sheets on your bed the night of your first shower and do not        sleep with pets.  Day of Surgery  Do not apply any lotions/deoderants the morning of surgery.  Please wear clean clothes to the hospital/surgery center.      Please read over the following fact sheets that you were given: Pain Booklet, Coughing and Deep Breathing, Blood Transfusion Information, MRSA Information and Surgical Site Infection Prevention

## 2014-03-04 LAB — URINE CULTURE: Colony Count: 3000

## 2014-03-09 MED ORDER — POVIDONE-IODINE 7.5 % EX SOLN
Freq: Once | CUTANEOUS | Status: DC
  Filled 2014-03-09: qty 118

## 2014-03-09 MED ORDER — CEFAZOLIN SODIUM-DEXTROSE 2-3 GM-% IV SOLR
2.0000 g | INTRAVENOUS | Status: AC
  Administered 2014-03-10: 2 g via INTRAVENOUS
  Filled 2014-03-09: qty 50

## 2014-03-09 MED ORDER — VANCOMYCIN HCL IN DEXTROSE 1-5 GM/200ML-% IV SOLN
1000.0000 mg | INTRAVENOUS | Status: AC
  Administered 2014-03-10: 1000 mg via INTRAVENOUS
  Filled 2014-03-09: qty 200

## 2014-03-09 MED ORDER — CHLORHEXIDINE GLUCONATE 4 % EX LIQD
60.0000 mL | Freq: Once | CUTANEOUS | Status: DC
  Filled 2014-03-09: qty 60

## 2014-03-09 MED ORDER — LACTATED RINGERS IV SOLN
INTRAVENOUS | Status: DC
  Administered 2014-03-10: 07:00:00 via INTRAVENOUS

## 2014-03-10 ENCOUNTER — Encounter (HOSPITAL_COMMUNITY)
Admission: RE | Disposition: A | Discharge status: Home Health | Payer: Self-pay | Source: Ambulatory Visit | Attending: Orthopedic Surgery

## 2014-03-10 ENCOUNTER — Encounter (HOSPITAL_COMMUNITY): Payer: Self-pay | Admitting: *Deleted

## 2014-03-10 ENCOUNTER — Inpatient Hospital Stay (HOSPITAL_COMMUNITY): Payer: BC Managed Care – PPO | Admitting: Anesthesiology

## 2014-03-10 ENCOUNTER — Inpatient Hospital Stay (HOSPITAL_COMMUNITY)
Admission: RE | Admit: 2014-03-10 | Discharge: 2014-03-11 | DRG: 470 | Disposition: A | Discharge status: Home Health | Payer: BC Managed Care – PPO | Source: Ambulatory Visit | Attending: Orthopedic Surgery | Admitting: Orthopedic Surgery

## 2014-03-10 ENCOUNTER — Encounter (HOSPITAL_COMMUNITY): Payer: BC Managed Care – PPO | Admitting: Anesthesiology

## 2014-03-10 DIAGNOSIS — Z8614 Personal history of Methicillin resistant Staphylococcus aureus infection: Secondary | ICD-10-CM | POA: Diagnosis present

## 2014-03-10 DIAGNOSIS — I428 Other cardiomyopathies: Secondary | ICD-10-CM | POA: Diagnosis present

## 2014-03-10 DIAGNOSIS — R011 Cardiac murmur, unspecified: Secondary | ICD-10-CM | POA: Diagnosis present

## 2014-03-10 DIAGNOSIS — T8859XA Other complications of anesthesia, initial encounter: Secondary | ICD-10-CM

## 2014-03-10 DIAGNOSIS — Z881 Allergy status to other antibiotic agents status: Secondary | ICD-10-CM

## 2014-03-10 DIAGNOSIS — M1712 Unilateral primary osteoarthritis, left knee: Secondary | ICD-10-CM | POA: Diagnosis present

## 2014-03-10 DIAGNOSIS — Z9109 Other allergy status, other than to drugs and biological substances: Secondary | ICD-10-CM

## 2014-03-10 DIAGNOSIS — M1710 Unilateral primary osteoarthritis, unspecified knee: Principal | ICD-10-CM | POA: Diagnosis present

## 2014-03-10 DIAGNOSIS — K219 Gastro-esophageal reflux disease without esophagitis: Secondary | ICD-10-CM | POA: Diagnosis present

## 2014-03-10 DIAGNOSIS — G43909 Migraine, unspecified, not intractable, without status migrainosus: Secondary | ICD-10-CM | POA: Diagnosis present

## 2014-03-10 DIAGNOSIS — I499 Cardiac arrhythmia, unspecified: Secondary | ICD-10-CM | POA: Diagnosis present

## 2014-03-10 DIAGNOSIS — M179 Osteoarthritis of knee, unspecified: Secondary | ICD-10-CM | POA: Diagnosis present

## 2014-03-10 DIAGNOSIS — T4145XA Adverse effect of unspecified anesthetic, initial encounter: Secondary | ICD-10-CM

## 2014-03-10 DIAGNOSIS — I422 Other hypertrophic cardiomyopathy: Secondary | ICD-10-CM | POA: Diagnosis present

## 2014-03-10 HISTORY — PX: TOTAL KNEE ARTHROPLASTY: SHX125

## 2014-03-10 HISTORY — PX: KNEE ARTHROSCOPY: SHX127

## 2014-03-10 SURGERY — ARTHROPLASTY, KNEE, TOTAL
Anesthesia: General | Site: Knee | Laterality: Left

## 2014-03-10 MED ORDER — PROPRANOLOL HCL ER 120 MG PO CP24
120.0000 mg | ORAL_CAPSULE | Freq: Every day | ORAL | Status: DC
  Administered 2014-03-10: 120 mg via ORAL
  Filled 2014-03-10 (×2): qty 1

## 2014-03-10 MED ORDER — FENTANYL CITRATE 0.05 MG/ML IJ SOLN
INTRAMUSCULAR | Status: AC
  Filled 2014-03-10: qty 5

## 2014-03-10 MED ORDER — BISACODYL 5 MG PO TBEC
10.0000 mg | DELAYED_RELEASE_TABLET | Freq: Every day | ORAL | Status: DC
  Administered 2014-03-10: 10 mg via ORAL
  Filled 2014-03-10: qty 2

## 2014-03-10 MED ORDER — LORATADINE 10 MG PO TABS
10.0000 mg | ORAL_TABLET | Freq: Every day | ORAL | Status: DC
  Administered 2014-03-10 – 2014-03-11 (×2): 10 mg via ORAL
  Filled 2014-03-10 (×2): qty 1

## 2014-03-10 MED ORDER — VANCOMYCIN HCL IN DEXTROSE 1-5 GM/200ML-% IV SOLN
1000.0000 mg | Freq: Two times a day (BID) | INTRAVENOUS | Status: AC
  Administered 2014-03-10: 1000 mg via INTRAVENOUS
  Filled 2014-03-10: qty 200

## 2014-03-10 MED ORDER — POTASSIUM GLUCONATE 595 (99 K) MG PO TABS: 595.0000 mg | ORAL_TABLET | Freq: Every day | ORAL | Status: DC

## 2014-03-10 MED ORDER — PHENYLEPHRINE 40 MCG/ML (10ML) SYRINGE FOR IV PUSH (FOR BLOOD PRESSURE SUPPORT)
PREFILLED_SYRINGE | INTRAVENOUS | Status: AC
  Filled 2014-03-10: qty 10

## 2014-03-10 MED ORDER — PROPOFOL 10 MG/ML IV BOLUS
INTRAVENOUS | Status: AC
  Filled 2014-03-10: qty 20

## 2014-03-10 MED ORDER — EPHEDRINE SULFATE 50 MG/ML IJ SOLN
INTRAMUSCULAR | Status: AC
  Filled 2014-03-10: qty 1

## 2014-03-10 MED ORDER — ADULT MULTIVITAMIN W/MINERALS CH
1.0000 | ORAL_TABLET | Freq: Every day | ORAL | Status: DC
  Administered 2014-03-10 – 2014-03-11 (×2): 1 via ORAL
  Filled 2014-03-10 (×2): qty 1

## 2014-03-10 MED ORDER — PHENYLEPHRINE HCL 10 MG/ML IJ SOLN
INTRAMUSCULAR | Status: DC | PRN
  Administered 2014-03-10 (×5): 80 ug via INTRAVENOUS

## 2014-03-10 MED ORDER — DIPHENHYDRAMINE HCL 12.5 MG/5ML PO ELIX: 12.5000 mg | ORAL_SOLUTION | ORAL | Status: DC | PRN

## 2014-03-10 MED ORDER — HYDROMORPHONE HCL PF 1 MG/ML IJ SOLN: 0.5000 mg | INTRAMUSCULAR | Status: DC | PRN

## 2014-03-10 MED ORDER — ATORVASTATIN CALCIUM 40 MG PO TABS
40.0000 mg | ORAL_TABLET | Freq: Every day | ORAL | Status: DC
  Administered 2014-03-10: 40 mg via ORAL
  Filled 2014-03-10 (×2): qty 1

## 2014-03-10 MED ORDER — RIVAROXABAN 10 MG PO TABS
10.0000 mg | ORAL_TABLET | Freq: Every day | ORAL | Status: DC
  Administered 2014-03-11: 10 mg via ORAL
  Filled 2014-03-10 (×2): qty 1

## 2014-03-10 MED ORDER — ONDANSETRON HCL 4 MG/2ML IJ SOLN
INTRAMUSCULAR | Status: AC
  Filled 2014-03-10: qty 2

## 2014-03-10 MED ORDER — OXYCODONE HCL 5 MG PO TABS: 5.0000 mg | ORAL_TABLET | Freq: Once | ORAL | Status: DC | PRN

## 2014-03-10 MED ORDER — PHENOL 1.4 % MT LIQD: 1.0000 | OROMUCOSAL | Status: DC | PRN

## 2014-03-10 MED ORDER — CEFUROXIME SODIUM 750 MG IJ SOLR
INTRAMUSCULAR | Status: AC
  Filled 2014-03-10: qty 1500

## 2014-03-10 MED ORDER — OXYCODONE HCL 5 MG/5ML PO SOLN: 5.0000 mg | Freq: Once | ORAL | Status: DC | PRN

## 2014-03-10 MED ORDER — ALUM & MAG HYDROXIDE-SIMETH 200-200-20 MG/5ML PO SUSP: 30.0000 mL | ORAL | Status: DC | PRN

## 2014-03-10 MED ORDER — NEOSTIGMINE METHYLSULFATE 10 MG/10ML IV SOLN
INTRAVENOUS | Status: DC | PRN
  Administered 2014-03-10: 2 mg via INTRAVENOUS

## 2014-03-10 MED ORDER — DEXAMETHASONE SODIUM PHOSPHATE 10 MG/ML IJ SOLN
10.0000 mg | Freq: Three times a day (TID) | INTRAMUSCULAR | Status: AC
  Administered 2014-03-11: 10 mg via INTRAVENOUS
  Filled 2014-03-10 (×3): qty 1

## 2014-03-10 MED ORDER — DEXAMETHASONE 4 MG PO TABS
10.0000 mg | ORAL_TABLET | Freq: Three times a day (TID) | ORAL | Status: AC
  Administered 2014-03-10 (×2): 10 mg via ORAL
  Filled 2014-03-10 (×4): qty 1

## 2014-03-10 MED ORDER — ROCURONIUM BROMIDE 100 MG/10ML IV SOLN
INTRAVENOUS | Status: DC | PRN
  Administered 2014-03-10: 30 mg via INTRAVENOUS

## 2014-03-10 MED ORDER — CELECOXIB 200 MG PO CAPS
200.0000 mg | ORAL_CAPSULE | Freq: Two times a day (BID) | ORAL | Status: DC
  Administered 2014-03-10 – 2014-03-11 (×3): 200 mg via ORAL
  Filled 2014-03-10 (×5): qty 1

## 2014-03-10 MED ORDER — HYOSCYAMINE SULFATE ER 0.375 MG PO TB12
0.3750 mg | ORAL_TABLET | Freq: Two times a day (BID) | ORAL | Status: DC | PRN
  Filled 2014-03-10: qty 1

## 2014-03-10 MED ORDER — FENTANYL CITRATE 0.05 MG/ML IJ SOLN
INTRAMUSCULAR | Status: DC | PRN
  Administered 2014-03-10 (×2): 50 ug via INTRAVENOUS

## 2014-03-10 MED ORDER — METOCLOPRAMIDE HCL 10 MG PO TABS: 5.0000 mg | ORAL_TABLET | Freq: Three times a day (TID) | ORAL | Status: DC | PRN

## 2014-03-10 MED ORDER — ONDANSETRON HCL 4 MG/2ML IJ SOLN: 4.0000 mg | Freq: Four times a day (QID) | INTRAMUSCULAR | Status: DC | PRN

## 2014-03-10 MED ORDER — SUCCINYLCHOLINE CHLORIDE 20 MG/ML IJ SOLN
INTRAMUSCULAR | Status: AC
  Filled 2014-03-10: qty 1

## 2014-03-10 MED ORDER — OXYCODONE HCL 5 MG PO TABS
5.0000 mg | ORAL_TABLET | ORAL | Status: DC | PRN
  Administered 2014-03-10 – 2014-03-11 (×7): 10 mg via ORAL
  Filled 2014-03-10 (×8): qty 2

## 2014-03-10 MED ORDER — HYDROMORPHONE HCL PF 1 MG/ML IJ SOLN
0.2500 mg | INTRAMUSCULAR | Status: DC | PRN
  Administered 2014-03-10 (×4): 0.5 mg via INTRAVENOUS

## 2014-03-10 MED ORDER — SODIUM CHLORIDE 0.9 % IV SOLN
10.0000 mg | INTRAVENOUS | Status: DC | PRN
  Administered 2014-03-10: 10 ug/min via INTRAVENOUS

## 2014-03-10 MED ORDER — GLYCOPYRROLATE 0.2 MG/ML IJ SOLN
INTRAMUSCULAR | Status: DC | PRN
  Administered 2014-03-10: 0.4 mg via INTRAVENOUS

## 2014-03-10 MED ORDER — DOCUSATE SODIUM 100 MG PO CAPS
100.0000 mg | ORAL_CAPSULE | Freq: Two times a day (BID) | ORAL | Status: DC
  Administered 2014-03-10 – 2014-03-11 (×2): 100 mg via ORAL
  Filled 2014-03-10 (×2): qty 1

## 2014-03-10 MED ORDER — ONDANSETRON HCL 4 MG/2ML IJ SOLN: 4.0000 mg | Freq: Once | INTRAMUSCULAR | Status: DC | PRN

## 2014-03-10 MED ORDER — PROPOFOL 10 MG/ML IV BOLUS
INTRAVENOUS | Status: DC | PRN
  Administered 2014-03-10: 160 mg via INTRAVENOUS

## 2014-03-10 MED ORDER — ONDANSETRON HCL 4 MG/2ML IJ SOLN
INTRAMUSCULAR | Status: DC | PRN
  Administered 2014-03-10: 4 mg via INTRAVENOUS

## 2014-03-10 MED ORDER — HYDROMORPHONE HCL PF 1 MG/ML IJ SOLN
INTRAMUSCULAR | Status: AC
  Filled 2014-03-10: qty 1

## 2014-03-10 MED ORDER — BUPIVACAINE-EPINEPHRINE (PF) 0.25% -1:200000 IJ SOLN
INTRAMUSCULAR | Status: DC | PRN
  Administered 2014-03-10: 30 mL

## 2014-03-10 MED ORDER — GLYCOPYRROLATE 0.2 MG/ML IJ SOLN
INTRAMUSCULAR | Status: AC
  Filled 2014-03-10: qty 2

## 2014-03-10 MED ORDER — MIDAZOLAM HCL 2 MG/2ML IJ SOLN
INTRAMUSCULAR | Status: AC
  Filled 2014-03-10: qty 2

## 2014-03-10 MED ORDER — MIDAZOLAM HCL 5 MG/5ML IJ SOLN
INTRAMUSCULAR | Status: DC | PRN
  Administered 2014-03-10: 2 mg via INTRAVENOUS

## 2014-03-10 MED ORDER — BUPIVACAINE-EPINEPHRINE (PF) 0.25% -1:200000 IJ SOLN
INTRAMUSCULAR | Status: AC
  Filled 2014-03-10: qty 30

## 2014-03-10 MED ORDER — ACETAMINOPHEN 650 MG RE SUPP: 650.0000 mg | Freq: Four times a day (QID) | RECTAL | Status: DC | PRN

## 2014-03-10 MED ORDER — SODIUM CHLORIDE 0.9 % IJ SOLN
INTRAMUSCULAR | Status: AC
  Filled 2014-03-10: qty 10

## 2014-03-10 MED ORDER — MEPERIDINE HCL 25 MG/ML IJ SOLN
6.2500 mg | INTRAMUSCULAR | Status: DC | PRN
Start: 2014-03-10 — End: 2014-03-10

## 2014-03-10 MED ORDER — CEFAZOLIN SODIUM-DEXTROSE 2-3 GM-% IV SOLR
INTRAVENOUS | Status: AC
  Filled 2014-03-10: qty 50

## 2014-03-10 MED ORDER — SUMATRIPTAN SUCCINATE 100 MG PO TABS
100.0000 mg | ORAL_TABLET | ORAL | Status: DC | PRN
  Filled 2014-03-10: qty 1

## 2014-03-10 MED ORDER — LIDOCAINE HCL (CARDIAC) 20 MG/ML IV SOLN
INTRAVENOUS | Status: DC | PRN
  Administered 2014-03-10: 50 mg via INTRAVENOUS

## 2014-03-10 MED ORDER — LIDOCAINE HCL (CARDIAC) 20 MG/ML IV SOLN
INTRAVENOUS | Status: AC
  Filled 2014-03-10: qty 5

## 2014-03-10 MED ORDER — ACETAMINOPHEN 325 MG PO TABS: 650.0000 mg | ORAL_TABLET | Freq: Four times a day (QID) | ORAL | Status: DC | PRN

## 2014-03-10 MED ORDER — METOCLOPRAMIDE HCL 5 MG/ML IJ SOLN: 5.0000 mg | Freq: Three times a day (TID) | INTRAMUSCULAR | Status: DC | PRN

## 2014-03-10 MED ORDER — ONDANSETRON HCL 4 MG PO TABS: 4.0000 mg | ORAL_TABLET | Freq: Four times a day (QID) | ORAL | Status: DC | PRN

## 2014-03-10 MED ORDER — POTASSIUM CHLORIDE IN NACL 20-0.9 MEQ/L-% IV SOLN
INTRAVENOUS | Status: DC
  Administered 2014-03-10: 18:00:00 via INTRAVENOUS
  Filled 2014-03-10 (×5): qty 1000

## 2014-03-10 MED ORDER — MENTHOL 3 MG MT LOZG: 1.0000 | LOZENGE | OROMUCOSAL | Status: DC | PRN

## 2014-03-10 MED ORDER — 0.9 % SODIUM CHLORIDE (POUR BTL) OPTIME
TOPICAL | Status: DC | PRN
Start: 2014-03-10 — End: 2014-03-10
  Administered 2014-03-10: 1000 mL

## 2014-03-10 MED ORDER — SODIUM CHLORIDE 0.9 % IR SOLN
Status: DC | PRN
  Administered 2014-03-10: 3000 mL

## 2014-03-10 MED ORDER — ROCURONIUM BROMIDE 50 MG/5ML IV SOLN
INTRAVENOUS | Status: AC
  Filled 2014-03-10: qty 1

## 2014-03-10 SURGICAL SUPPLY — 79 items
BANDAGE ELASTIC 4 VELCRO ST LF (GAUZE/BANDAGES/DRESSINGS) ×4 IMPLANT
BANDAGE ELASTIC 6 VELCRO ST LF (GAUZE/BANDAGES/DRESSINGS) ×4 IMPLANT
BANDAGE ESMARK 6X9 LF (GAUZE/BANDAGES/DRESSINGS) ×2 IMPLANT
BLADE CUTTER GATOR 3.5 (BLADE) ×4 IMPLANT
BLADE SAG 18X100X1.27 (BLADE) ×4 IMPLANT
BLADE SAW SGTL 13.0X1.19X90.0M (BLADE) ×4 IMPLANT
BLADE SURG 10 STRL SS (BLADE) ×4 IMPLANT
BLADE SURG 11 STRL SS (BLADE) ×4 IMPLANT
BNDG ESMARK 6X9 LF (GAUZE/BANDAGES/DRESSINGS) ×4
BOWL SMART MIX CTS (DISPOSABLE) ×4 IMPLANT
CAPT RP KNEE ×4 IMPLANT
CEMENT HV SMART SET (Cement) ×8 IMPLANT
CLOSURE WOUND 1/2 X4 (GAUZE/BANDAGES/DRESSINGS) ×1
COVER BACK TABLE 24X17X13 BIG (DRAPES) IMPLANT
COVER SURGICAL LIGHT HANDLE (MISCELLANEOUS) ×4 IMPLANT
CUFF TOURNIQUET SINGLE 34IN LL (TOURNIQUET CUFF) ×4 IMPLANT
CUFF TOURNIQUET SINGLE 44IN (TOURNIQUET CUFF) IMPLANT
DRAPE EXTREMITY T 121X128X90 (DRAPE) ×4 IMPLANT
DRAPE INCISE IOBAN 66X45 STRL (DRAPES) ×4 IMPLANT
DRAPE PROXIMA HALF (DRAPES) ×8 IMPLANT
DRAPE U-SHAPE 47X51 STRL (DRAPES) ×4 IMPLANT
DRSG ADAPTIC 3X8 NADH LF (GAUZE/BANDAGES/DRESSINGS) ×4 IMPLANT
DRSG PAD ABDOMINAL 8X10 ST (GAUZE/BANDAGES/DRESSINGS) ×8 IMPLANT
DURAPREP 26ML APPLICATOR (WOUND CARE) ×4 IMPLANT
ELECT CAUTERY BLADE 6.4 (BLADE) ×4 IMPLANT
ELECT REM PT RETURN 9FT ADLT (ELECTROSURGICAL) ×4
ELECTRODE REM PT RTRN 9FT ADLT (ELECTROSURGICAL) ×2 IMPLANT
EVACUATOR 1/8 PVC DRAIN (DRAIN) IMPLANT
FACESHIELD WRAPAROUND (MASK) ×4 IMPLANT
GLOVE BIO SURGEON STRL SZ7 (GLOVE) ×4 IMPLANT
GLOVE BIOGEL PI IND STRL 7.0 (GLOVE) ×4 IMPLANT
GLOVE BIOGEL PI IND STRL 7.5 (GLOVE) ×2 IMPLANT
GLOVE BIOGEL PI IND STRL 8 (GLOVE) IMPLANT
GLOVE BIOGEL PI INDICATOR 7.0 (GLOVE) ×4
GLOVE BIOGEL PI INDICATOR 7.5 (GLOVE) ×2
GLOVE BIOGEL PI INDICATOR 8 (GLOVE)
GLOVE BIOGEL PI ORTHO PRO 7.5 (GLOVE)
GLOVE PI ORTHO PRO STRL 7.5 (GLOVE) IMPLANT
GLOVE SS BIOGEL STRL SZ 7.5 (GLOVE) ×2 IMPLANT
GLOVE SUPERSENSE BIOGEL SZ 7.5 (GLOVE) ×2
GLOVE SURG SS PI 7.0 STRL IVOR (GLOVE) ×4 IMPLANT
GOWN STRL REUS W/ TWL LRG LVL3 (GOWN DISPOSABLE) ×2 IMPLANT
GOWN STRL REUS W/ TWL XL LVL3 (GOWN DISPOSABLE) ×6 IMPLANT
GOWN STRL REUS W/TWL LRG LVL3 (GOWN DISPOSABLE) ×3
GOWN STRL REUS W/TWL XL LVL3 (GOWN DISPOSABLE) ×9
HANDPIECE INTERPULSE COAX TIP (DISPOSABLE) ×3
HOLDER FOLEY CATH W/STRAP (MISCELLANEOUS) ×4 IMPLANT
HOOD PEEL AWAY FACE SHEILD DIS (HOOD) ×12 IMPLANT
IMMOBILIZER KNEE 22 UNIV (SOFTGOODS) ×4 IMPLANT
KIT BASIN OR (CUSTOM PROCEDURE TRAY) ×4 IMPLANT
KIT ROOM TURNOVER OR (KITS) ×4 IMPLANT
MANIFOLD NEPTUNE II (INSTRUMENTS) ×4 IMPLANT
MARKER SKIN DUAL TIP RULER LAB (MISCELLANEOUS) ×4 IMPLANT
NEEDLE SPNL 18GX3.5 QUINCKE PK (NEEDLE) ×4 IMPLANT
NS IRRIG 1000ML POUR BTL (IV SOLUTION) ×4 IMPLANT
PACK TOTAL JOINT (CUSTOM PROCEDURE TRAY) ×4 IMPLANT
PAD ARMBOARD 7.5X6 YLW CONV (MISCELLANEOUS) ×8 IMPLANT
PAD CAST 4YDX4 CTTN HI CHSV (CAST SUPPLIES) ×2 IMPLANT
PADDING CAST COTTON 4X4 STRL (CAST SUPPLIES) ×3
PADDING CAST COTTON 6X4 STRL (CAST SUPPLIES) ×4 IMPLANT
PENCIL BUTTON HOLSTER BLD 10FT (ELECTRODE) ×4 IMPLANT
RUBBERBAND STERILE (MISCELLANEOUS) ×4 IMPLANT
SET HNDPC FAN SPRY TIP SCT (DISPOSABLE) ×2 IMPLANT
SPONGE GAUZE 4X4 12PLY (GAUZE/BANDAGES/DRESSINGS) ×4 IMPLANT
STRIP CLOSURE SKIN 1/2X4 (GAUZE/BANDAGES/DRESSINGS) ×3 IMPLANT
SUCTION FRAZIER TIP 10 FR DISP (SUCTIONS) ×4 IMPLANT
SUT ETHIBOND NAB CT1 #1 30IN (SUTURE) ×4 IMPLANT
SUT MNCRL AB 3-0 PS2 18 (SUTURE) ×4 IMPLANT
SUT VIC AB 0 CT1 27 (SUTURE) ×4
SUT VIC AB 0 CT1 27XBRD ANBCTR (SUTURE) ×4 IMPLANT
SUT VIC AB 2-0 CT1 27 (SUTURE) ×4
SUT VIC AB 2-0 CT1 TAPERPNT 27 (SUTURE) ×4 IMPLANT
SYR 30ML LL (SYRINGE) IMPLANT
SYR 30ML SLIP (SYRINGE) ×4 IMPLANT
TAPE HY-TAPE 1X5Y PINK NS LF (GAUZE/BANDAGES/DRESSINGS) ×4 IMPLANT
TOWEL OR 17X24 6PK STRL BLUE (TOWEL DISPOSABLE) ×4 IMPLANT
TOWEL OR 17X26 10 PK STRL BLUE (TOWEL DISPOSABLE) ×4 IMPLANT
TRAY FOLEY CATH 16FRSI W/METER (SET/KITS/TRAYS/PACK) ×4 IMPLANT
WATER STERILE IRR 1000ML POUR (IV SOLUTION) ×8 IMPLANT

## 2014-03-10 NOTE — Evaluation (Signed)
Physical Therapy Evaluation Patient Details Name: Heidi Webster MRN: [id number] DOB: Jan 20, 1961 Today's Date: 03/10/2014   History of Present Illness  Patient is a 53 yo female admitted 03/10/14 now s/p Lt TKA.  PMH: cardiomyopathy, dysrhythmia, migraines, back pain (arthritis).  Reports she needs to have her Rt knee replaced as well.  Clinical Impression  Patient presents with problems listed below. Will benefit from acute PT to maximize independence prior to discharge home with husband.      Follow Up Recommendations Home health PT;Supervision/Assistance - 24 hour    Equipment Recommendations  None recommended by PT    Recommendations for Other Services       Precautions / Restrictions Precautions Precautions: Knee Precaution Booklet Issued: Yes (comment) Precaution Comments: Reviewed precautions. Required Braces or Orthoses: Knee Immobilizer - Left Knee Immobilizer - Left: On when out of bed or walking Restrictions Weight Bearing Restrictions: Yes LLE Weight Bearing: Weight bearing as tolerated      Mobility  Bed Mobility Overal bed mobility: Needs Assistance Bed Mobility: Supine to Sit     Supine to sit: Min guard     General bed mobility comments: Instructed patient on donning KI on LLE.  Verbal cues for technique for bed mobility.  Assist to manage LLE.  Transfers Overall transfer level: Needs assistance Equipment used: Rolling walker (2 wheeled) Transfers: Sit to/from Stand Sit to Stand: Min guard         General transfer comment: Verbal cues for hand placement.  Assist for balance/safety only.  Ambulation/Gait Ambulation/Gait assistance: Min assist Ambulation Distance (Feet): 32 Feet Assistive device: Rolling walker (2 wheeled) Gait Pattern/deviations: Step-to pattern;Decreased stance time - left;Decreased step length - right;Decreased stride length;Antalgic Gait velocity: Decreased Gait velocity interpretation: Below normal speed for  age/gender General Gait Details: Verbal cues for gait sequence.   Stairs            Wheelchair Mobility    Modified Rankin (Stroke Patients Only)       Balance Overall balance assessment: Needs assistance Sitting-balance support: No upper extremity supported;Feet supported Sitting balance-Leahy Scale: Good     Standing balance support: Bilateral upper extremity supported Standing balance-Leahy Scale: Poor Standing balance comment: Balance improved with RW                             Pertinent Vitals/Pain Pain 6/10 with mobility.    Home Living Family/patient expects to be discharged to:: Private residence Living Arrangements: Spouse/significant other;Children Available Help at Discharge: Family;Available 24 hours/day Type of Home: House Home Access: Stairs to enter Entrance Stairs-Rails: None Entrance Stairs-Number of Steps: 1 Home Layout: Two level;Bed/bath upstairs Advertising account executive) Home Equipment: Environmental consultant - 2 wheels;Bedside commode      Prior Function Level of Independence: Independent with assistive device(s)         Comments: Using cane for ambulation due to pain.     Hand Dominance        Extremity/Trunk Assessment   Upper Extremity Assessment: Overall WFL for tasks assessed           Lower Extremity Assessment: LLE deficits/detail   LLE Deficits / Details: Decreased strength/ROM post-op.  Able to lift LLE against gravity.  Cervical / Trunk Assessment: Normal  Communication   Communication: No difficulties  Cognition Arousal/Alertness: Awake/alert Behavior During Therapy: WFL for tasks assessed/performed Overall Cognitive Status: Within Functional Limits for tasks assessed  General Comments      Exercises Total Joint Exercises Ankle Circles/Pumps: AROM;Both;10 reps;Seated Quad Sets: AROM;Left;10 reps;Seated      Assessment/Plan    PT Assessment Patient needs continued PT services  PT  Diagnosis Difficulty walking;Acute pain   PT Problem List Decreased strength;Decreased range of motion;Decreased activity tolerance;Decreased balance;Decreased mobility;Decreased knowledge of use of DME;Decreased knowledge of precautions;Pain  PT Treatment Interventions DME instruction;Gait training;Functional mobility training;Therapeutic exercise;Patient/family education   PT Goals (Current goals can be found in the Care Plan section) Acute Rehab PT Goals Patient Stated Goal: To go home soon PT Goal Formulation: With patient Time For Goal Achievement: 03/17/14 Potential to Achieve Goals: Good    Frequency 7X/week   Barriers to discharge        Co-evaluation               End of Session Equipment Utilized During Treatment: Gait belt;Left knee immobilizer Activity Tolerance: Patient tolerated treatment well Patient left: in chair;with call bell/phone within reach;with family/visitor present Nurse Communication: Mobility status         Time: [id number] PT Time Calculation (min): 27 min   Charges:   PT Evaluation $Initial PT Evaluation Tier I: 1 Procedure PT Treatments $Gait Training: 8-22 mins   PT G Codes:          Vena AustriaDavis, Viktoria Gruetzmacher H 03/10/2014, 3:59 PM Durenda HurtSusan H. Renaldo Fiddleravis, PT, [facility] Acute Rehab Services Pager [phone]

## 2014-03-10 NOTE — Op Note (Signed)
MRN:     [id number] DOB/AGE:    11/01/1960 / 53 y.o.       OPERATIVE REPORT    DATE OF PROCEDURE:  03/10/2014       PREOPERATIVE DIAGNOSIS:   END STAGE DEGENERATIVE JOINT DISEASE      Estimated body mass index is 30.27 kg/(m^2) as calculated from the following:   Height as of 03/03/14: 5' (1.524 m).   Weight as of 11/27/13: 70.308 kg (155 lb).                                                        POSTOPERATIVE DIAGNOSIS:   END STAGE DEGENERATIVE JOINT DISEASE                                                                      PROCEDURE:  Procedure(s): Right knee arthroscopy followed by right knee TOTAL KNEE ARTHROPLASTY ARTHROSCOPY KNEE Using Depuy Sigma RP implants #2.5 Femur, #2.5Tibia, 10mm sigma RP bearing, 32 Patella     SURGEON: Abbigail Anstey A    ASSISTANT:  Kirstin Shepperson PA-C   (Present and scrubbed throughout the case, critical for assistance with exposure, retraction, instrumentation, and closure.)         ANESTHESIA: GET with Femoral Nerve Block  DRAINS: foley, 2 medium hemovac in knee   TOURNIQUET TIME: 75min   COMPLICATIONS:  None     SPECIMENS: None   INDICATIONS FOR PROCEDURE: The patient has  END STAGE DEGENERATIVE JOINT DISEASE, varus deformities, XR shows bone on bone arthritis. Patient has failed all conservative measures including anti-inflammatory medicines, narcotics, attempts at  exercise and weight loss, cortisone injections and viscosupplementation.  Risks and benefits of surgery have been discussed, questions answered.   DESCRIPTION OF PROCEDURE: The patient identified by armband, received  right femoral nerve block and IV antibiotics, [facility]. Patient taken to the operating room, appropriate anesthetic  monitors were attached General endotracheal anesthesia induced with  the patient in supine position, Foley catheter was inserted. Tourniquet  applied high to the operative thigh. Lateral post and foot positioner   applied to the table, the lower extremity was then prepped and draped  in usual sterile fashion from the ankle to the tourniquet. Time-out procedure was performed. The limb was wrapped with an Esmarch bandage and the tourniquet inflated to 365 mmHg. We first began with an arthroscopy to determine if she was a uni candidate. Her Medial compartment was bone on bone,  Her lateral compartnment and PF compartment showed significant grade III changes and thus we felst she was a total knee and not a uni candidate.  We then proceeded with making the anterior midline incision starting at handbreadth above the patella going over the patella 1 cm medial to and  4 cm distal to the tibial tubercle. Small bleeders in the skin and the  subcutaneous tissue identified and cauterized. Transverse retinaculum was incised and reflected medially and a medial parapatellar arthrotomy was accomplished. the patella was everted and theprepatellar fat pad resected. The superficial medial collateral  ligament  was then elevated from anterior to posterior along the proximal  flare of the tibia and anterior half of the menisci resected. The knee was hyperflexed exposing bone on bone arthritis. Peripheral and notch osteophytes as well as the cruciate ligaments were then resected. We continued to  work our way around posteriorly along the proximal tibia, and externally  rotated the tibia subluxing it out from underneath the femur. A McHale  retractor was placed through the notch and a lateral Hohmann retractor  placed, and we then drilled through the proximal tibia in line with the  axis of the tibia followed by an intramedullary guide rod and 2-degree  posterior slope cutting guide. The tibial cutting guide was pinned into place  allowing resection of 4 mm of bone medially and about 6 mm of bone  laterally because of her varus deformity. Satisfied with the tibial resection, we then  entered the distal femur 2 mm anterior to the PCL  origin with the  intramedullary guide rod and applied the distal femoral cutting guide  set at 83mm, with 5 degrees of valgus. This was pinned along the  epicondylar axis. At this point, the distal femoral cut was accomplished without difficulty. We then sized for a #2.5 femoral component and pinned the guide in 3 degrees of external rotation.The chamfer cutting guide was pinned into place. The anterior, posterior, and chamfer cuts were accomplished without difficulty followed by  the Sigma RP box cutting guide and the box cut. We also removed posterior osteophytes from the posterior femoral condyles. At this  time, the knee was brought into full extension. We checked our  extension and flexion gaps and found them symmetric at 66mm.  The patella thickness measured at 23 mm. We set the cutting guide at 15 and removed the posterior 8 mm  of the patella sized for 32 button and drilled the lollipop. The knee  was then once again hyperflexed exposing the proximal tibia. We sized for a #2.5 tibial base plate, applied the smokestack and the conical reamer followed by the the Delta fin keel punch. We then hammered into place the Sigma RP trial femoral component, inserted a 10-mm trial bearing, trial patellar button, and took the knee through range of motion from 0-130 degrees. No thumb pressure was required for patellar  tracking. At this point, all trial components were removed, a double batch of DePuy HV cement  was mixed and applied to all bony metallic mating surfaces except for the posterior condyles of the femur itself. In order, we  hammered into place the tibial tray and removed excess cement, the femoral component and removed excess cement, a 10-mm Sigma RP bearing  was inserted, and the knee brought to full extension with compression.  The patellar button was clamped into place, and excess cement  removed. While the cement cured the wound was irrigated out with normal saline solution pulse lavage,  and medium Hemovac drains were placed.. Ligament stability and patellar tracking were checked and found to be excellent. The tourniquet was then released and hemostasis was obtained with cautery. The parapatellar arthrotomy was closed with  #1 ethibond suture. The subcutaneous tissue with 0 and 2-0 undyed  Vicryl suture, and 4-0 Monocryl.. A dressing of Xeroform,  4 x 4, dressing sponges, Webril, and Ace wrap applied. Needle and sponge count were correct times 2.The patient awakened, extubated, and taken to recovery room without difficulty. Vascular status was normal, pulses 2+ and symmetric.   Othar Curto A 03/10/2014, 8:55 AM

## 2014-03-10 NOTE — Anesthesia Postprocedure Evaluation (Signed)
Anesthesia Post Note  Patient: Heidi Webster  Procedure(s) Performed: Procedure(s) (LRB): TOTAL KNEE ARTHROPLASTY (Left) ARTHROSCOPY KNEE (Left)  Anesthesia type: general  Patient location: PACU  Post pain: Pain level controlled  Post assessment: Patient's Cardiovascular Status Stable  Last Vitals:  Filed Vitals:   03/10/14 1048  BP: 113/66  Pulse:   Temp: 36.4 C  Resp: 18    Post vital signs: Reviewed and stable  Level of consciousness: sedated  Complications: No apparent anesthesia complications

## 2014-03-10 NOTE — Anesthesia Preprocedure Evaluation (Addendum)
Anesthesia Evaluation  Patient identified by MRN, date of birth, ID band Patient awake    Reviewed: Allergy & Precautions, H&P , NPO status , Patient's Chart, lab work & pertinent test results, reviewed documented beta blocker date and time   Airway Mallampati: I TM Distance: >3 FB Neck ROM: Full    Dental  (+) Teeth Intact   Pulmonary          Cardiovascular + dysrhythmias     Neuro/Psych    GI/Hepatic GERD-  Medicated and Controlled,  Endo/Other    Renal/GU      Musculoskeletal   Abdominal   Peds  Hematology   Anesthesia Other Findings   Reproductive/Obstetrics                          Anesthesia Physical Anesthesia Plan  ASA: II  Anesthesia Plan: General   Post-op Pain Management:    Induction: Intravenous  Airway Management Planned: Oral ETT  Additional Equipment:   Intra-op Plan:   Post-operative Plan: Extubation in OR  Informed Consent: I have reviewed the patients History and Physical, chart, labs and discussed the procedure including the risks, benefits and alternatives for the proposed anesthesia with the patient or authorized representative who has indicated his/her understanding and acceptance.   Dental advisory given  Plan Discussed with: CRNA  Anesthesia Plan Comments:        Anesthesia Quick Evaluation

## 2014-03-10 NOTE — Plan of Care (Signed)
Problem: Consults Goal: Diagnosis- Total Joint Replacement Primary Total Knee Right     

## 2014-03-10 NOTE — Anesthesia Procedure Notes (Addendum)
Procedure Name: Intubation Date/Time: 03/10/2014 7:22 AM Performed by: Quentin Ore Pre-anesthesia Checklist: Patient identified, Emergency Drugs available, Suction available, Patient being monitored and Timeout performed Patient Re-evaluated:Patient Re-evaluated prior to inductionOxygen Delivery Method: Circle system utilized Preoxygenation: Pre-oxygenation with 100% oxygen Intubation Type: IV induction Ventilation: Mask ventilation without difficulty Laryngoscope Size: Mac and 3 Grade View: Grade I Tube type: Oral Tube size: 7.0 mm Number of attempts: 1 Airway Equipment and Method: Stylet Placement Confirmation: ETT inserted through vocal cords under direct vision,  positive ETCO2 and breath sounds checked- equal and bilateral Secured at: 21 cm Tube secured with: Tape Dental Injury: Teeth and Oropharynx as per pre-operative assessment     Anesthesia Regional Block:  Adductor canal block  Pre-Anesthetic Checklist: ,, timeout performed, Correct Patient, Correct Site, Correct Laterality, Correct Procedure, Correct Position, site marked, Risks and benefits discussed,  Surgical consent,  Pre-op evaluation,  At surgeon's request and post-op pain management  Laterality: Left  Prep: chloraprep       Needles:  Injection technique: Single-shot  Needle Type: Echogenic Stimulator Needle     Needle Length: 9cm 9 cm Needle Gauge: 21 and 21 G    Additional Needles:  Procedures: ultrasound guided (picture in chart) Adductor canal block Narrative:  Start time: 03/10/2014 7:00 AM End time: 03/10/2014 7:10 AM Injection made incrementally with aspirations every 5 mL.  Performed by: Personally  Anesthesiologist: Arta Bruce MD  Additional Notes: Monitors applied. Patient sedated. Sterile prep and drape,hand hygiene and sterile gloves were used. Relevant anatomy identified.Needle position confirmed.Local anesthetic injected incrementally after negative aspiration. Local anesthetic  spread visualized around nerve(s). Vascular puncture avoided. No complications. Image printed for medical record.The patient tolerated the procedure well.    Arta Bruce MD

## 2014-03-10 NOTE — Interval H&P Note (Signed)
History and Physical Interval Note:  03/10/2014 7:03 AM  Heidi Webster  has presented today for surgery, with the diagnosis of djd left knee  The various methods of treatment have been discussed with the patient and family. After consideration of risks, benefits and other options for treatment, the patient has consented to  Procedure(s): UNICOMPARTMENTAL KNEE (Left) as a surgical intervention .  The patient's history has been reviewed, patient examined, no change in status, stable for surgery.  I have reviewed the patient's chart and labs.  Questions were answered to the patient's satisfaction.     Salvatore Marvel A

## 2014-03-10 NOTE — H&P (View-Only) (Signed)
TOTAL KNEE ADMISSION H&P  Patient is being admitted for left total knee arthroplasty.  Subjective:  Chief Complaint:left knee pain.  HPI: Heidi Webster, 53 y.o. female, has a history of pain and functional disability in the left knee due to arthritis and has failed non-surgical conservative treatments for greater than 12 weeks to includeNSAID's and/or analgesics, corticosteriod injections, viscosupplementation injections, flexibility and strengthening excercises, supervised PT with diminished ADL's post treatment, use of assistive devices, weight reduction as appropriate and activity modification.  Onset of symptoms was gradual, starting 10 years ago with gradually worsening course since that time. The patient noted no past surgery on the right knee(s).  Patient currently rates pain in the right knee(s) at 10 out of 10 with activity. Patient has night pain, worsening of pain with activity and weight bearing, pain that interferes with activities of daily living, crepitus and joint swelling.  Patient has evidence of subchondral sclerosis, periarticular osteophytes and joint space narrowing by imaging studies.  There is no active infection.  Patient Active Problem List   Diagnosis Date Noted  . Cardiomyopathy, hypertrophic   . GERD (gastroesophageal reflux disease)   . Dysrhythmia   . Complication of anesthesia   . Diverticula of colon   . Kidney stone   . Left knee DJD   . Heart murmur   . Migraines   . Hx MRSA infection    Past Medical History  Diagnosis Date  . Migraines   . Hx MRSA infection 2009  . Diverticula of colon 2009  . Left knee DJD   . Heart murmur   . Complication of anesthesia     slow to wake up  . Dysrhythmia     tachycardia  . History of kidney stones   . GERD (gastroesophageal reflux disease)     occ   . Cardiomyopathy, hypertrophic     hypertrophic obstructive cardiomyopathy (Dr. Ganji)    Past Surgical History  Procedure Laterality Date  . Colon surgery   2009    perforated Diverticula  . Shoulder capsulorrhaphy Right 1991  . Cardiac catheterization  1/15     (Not in a hospital admission) Allergies  Allergen Reactions  . Augmentin [Amoxicillin-Pot Clavulanate] Rash  . Tape Rash    bandaides    History  Substance Use Topics  . Smoking status: Never Smoker   . Smokeless tobacco: Never Used  . Alcohol Use: Yes     Comment: 1 drink a week    Family History  Problem Relation Age of Onset  . Diabetes Mother   . Heart disease Mother     murmur and cardiomyopathy  . Heart disease Father   . Arthritis-Osteo Father      Review of Systems  Constitutional: Negative.   HENT: Negative.   Eyes: Negative.   Respiratory: Negative.   Cardiovascular: Negative.   Gastrointestinal: Negative.   Genitourinary: Negative.   Musculoskeletal: Positive for back pain and joint pain.  Skin: Negative.   Neurological: Negative.   Endo/Heme/Allergies: Negative.   Psychiatric/Behavioral: Negative.     Objective:  Physical Exam  Constitutional: She is oriented to person, place, and time. She appears well-developed and well-nourished.  HENT:  Head: Normocephalic and atraumatic.  Mouth/Throat: Oropharynx is clear and moist.  Eyes: Pupils are equal, round, and reactive to light.  Neck: Neck supple.  Cardiovascular: Normal rate and regular rhythm.   Murmur heard. Respiratory: Effort normal and breath sounds normal.  GI: Soft. Bowel sounds are normal.  Genitourinary:  Not   pertinent to current symptomatology therefore not examined.  Musculoskeletal:  She ambulates with moderate antalgic gait varus deformity 2+ crepitus active range of motion -5 to 120 degrees bilaterally. Distal neurovascular exam is intact.  Neurological: She is alert and oriented to person, place, and time.  Skin: Skin is warm and dry.  Psychiatric: She has a normal mood and affect. Her behavior is normal.    Vital signs in last 24 hours: Last recorded: 07/01 1300   BP:  110/73 Pulse: 98  Temp: 98.2 F (36.8 C)    Height: 5' (1.524 m) SpO2: 96  Weight: 70.761 kg (156 lb)     Labs:   Estimated body mass index is 30.47 kg/(m^2) as calculated from the following:   Height as of this encounter: 5' (1.524 m).   Weight as of this encounter: 70.761 kg (156 lb).   Imaging Review Plain radiographs demonstrate severe degenerative joint disease of the bilaterally knee(s). The overall alignment issignificant varus. The bone quality appears to be good for age and reported activity level.  Assessment/Plan:  End stage arthritis, left knee   The patient history, physical examination, clinical judgment of the provider and imaging studies are consistent with end stage degenerative joint disease of the left knee(s) and total knee arthroplasty is deemed medically necessary. The treatment options including medical management, injection therapy arthroscopy and arthroplasty were discussed at length. The risks and benefits of total knee arthroplasty were presented and reviewed. The risks due to aseptic loosening, infection, stiffness, patella tracking problems, thromboembolic complications and other imponderables were discussed. The patient acknowledged the explanation, agreed to proceed with the plan and consent was signed. Patient is being admitted for inpatient treatment for surgery, pain control, PT, OT, prophylactic antibiotics, VTE prophylaxis, progressive ambulation and ADL's and discharge planning. The patient is planning to be discharged home with home health services  Elijio Staples A. Gwinda Passe Physician Assistant [facility] [phone]  02/26/2014, 2:56 PM

## 2014-03-10 NOTE — Transfer of Care (Signed)
Immediate Anesthesia Transfer of Care Note  Patient: Heidi Webster  Procedure(s) Performed: Procedure(s): TOTAL KNEE ARTHROPLASTY (Left) ARTHROSCOPY KNEE (Left)  Patient Location: PACU  Anesthesia Type:General  Level of Consciousness: awake, alert  and oriented  Airway & Oxygen Therapy: Patient Spontanous Breathing and Patient connected to nasal cannula oxygen  Post-op Assessment: Report given to PACU RN, Post -op Vital signs reviewed and stable and Patient moving all extremities X 4  Post vital signs: Reviewed and stable  Complications: No apparent anesthesia complications

## 2014-03-10 NOTE — Evaluation (Signed)
Occupational Therapy Evaluation Patient Details Name: Heidi Webster MRN: [id number] DOB: 1961-08-19 Today's Date: 03/10/2014    History of Present Illness Patient is a 53 yo female admitted 03/10/14 now s/p Lt TKA.  PMH: cardiomyopathy, dysrhythmia, migraines, back pain (arthritis).  Reports she needs to have her Rt knee replaced as well.   Clinical Impression   Pt s/p above. Education provided to pt and friend during session. No further OT needs at this time.    Follow Up Recommendations  No OT follow up;Supervision - Intermittent    Equipment Recommendations  None recommended by OT    Recommendations for Other Services       Precautions / Restrictions Precautions Precautions: Knee Precaution Comments: Reviewed no twisting knee Required Braces or Orthoses: Knee Immobilizer - Left Knee Immobilizer - Left: On when out of bed or walking Restrictions Weight Bearing Restrictions: Yes LLE Weight Bearing: Weight bearing as tolerated      Mobility Bed Mobility Overal bed mobility:  (not assessed)   Transfers Overall transfer level: Needs assistance Equipment used: Rolling walker (2 wheeled) Transfers: Sit to/from Stand Sit to Stand: Supervision                  ADL Overall ADL's : Needs assistance/impaired     Grooming: Oral care;Set up;Supervision/safety;Standing               Lower Body Dressing: Set up;Supervision/safety;Sit to/from stand   Toilet Transfer: Supervision/safety;Ambulation;RW (chair)           Functional mobility during ADLs: Rolling walker;Supervision/safety General ADL Comments: Educated on use of bag on walker, safe shoewear, recommended sitting for most of LB dressing. Recommended someone be with her for tub transfer. Reviewed dressing technique. Pt donned panties and was able to don/doff left sock. Explained benefit of reaching down to don/doff left sock as it increases ROM in knee. Told pt may be helpful to keep reacher in bag on  walker. Explained AE is available for LB dressing if needed. Recommended pt stand in front of chair/bed with walker in front when pulling up LB clothing. Pt did not feel need to practice tub transfer and could verbalize technique.     Vision                     Perception     Praxis      Pertinent Vitals/Pain Pain 4/10. Increased activity during session.      Hand Dominance     Extremity/Trunk Assessment Upper Extremity Assessment Upper Extremity Assessment: Overall WFL for tasks assessed   Lower Extremity Assessment Lower Extremity Assessment: Defer to PT evaluation LLE Deficits / Details: Decreased strength/ROM post-op.  Able to lift LLE against gravity.   Cervical / Trunk Assessment Cervical / Trunk Assessment: Normal   Communication Communication Communication: No difficulties   Cognition Arousal/Alertness: Awake/alert Behavior During Therapy: WFL for tasks assessed/performed Overall Cognitive Status: Within Functional Limits for tasks assessed                     General Comments          Shoulder Instructions      Home Living Family/patient expects to be discharged to:: Private residence Living Arrangements: Spouse/significant other;Children Available Help at Discharge: Family;Available 24 hours/day Type of Home: House Home Access: Stairs to enter [employer] of Steps: 1 Entrance Stairs-Rails: None Home Layout: Two level;Bed/bath upstairs Advertising account executive)     Bathroom Shower/Tub: Tub/shower unit   [employer]  Toilet:  (has BSC)     Home Equipment: Walker - 2 wheels;Bedside commode;Tub bench;Adaptive equipment Adaptive Equipment: Reacher        Prior Functioning/Environment Level of Independence: Independent with assistive device(s)        Comments: Using cane for ambulation due to pain.    OT Diagnosis:     OT Problem List:     OT Treatment/Interventions:      OT Goals(Current goals can be found in the care plan  section)   OT Frequency:     Barriers to D/C:            Co-evaluation              End of Session Equipment Utilized During Treatment: Gait belt;Rolling walker;Left knee immobilizer CPM Left Knee CPM Left Knee: Off (off at 15:10.)  Activity Tolerance: Patient tolerated treatment well Patient left: in chair;with call bell/phone within reach;with family/visitor present   Time: [id number] OT Time Calculation (min): 18 min Charges:  OT General Charges $OT Visit: 1 Procedure OT Evaluation $Initial OT Evaluation Tier I: 1 Procedure OT Treatments $Self Care/Home Management : 8-22 mins G-CodesEarlie Raveling:    Shana Younge L  OTR/L [phone]  03/10/2014, 4:25 PM

## 2014-03-10 NOTE — Progress Notes (Signed)
Orthopedic Tech Progress Note Patient Details:  Heidi Webster 18-Mar-1961 [id number] CPM applied to LLE with appropriate settings. OHF applied to bed. Footsie roll provided. CPM Left Knee CPM Left Knee: On Left Knee Flexion (Degrees): 90 Left Knee Extension (Degrees): 0   Asia R Thompson 03/10/2014, 10:37 AM

## 2014-03-11 ENCOUNTER — Encounter (HOSPITAL_COMMUNITY): Payer: Self-pay | Admitting: Orthopedic Surgery

## 2014-03-11 LAB — BASIC METABOLIC PANEL
Anion gap: 14 (ref 5–15)
BUN: 9 mg/dL (ref 6–23)
CALCIUM: 9.3 mg/dL (ref 8.4–10.5)
CO2: 22 meq/L (ref 19–32)
Chloride: 106 mEq/L (ref 96–112)
Creatinine, Ser: 0.59 mg/dL (ref 0.50–1.10)
GFR calc Af Amer: >90 mL/min (ref >90)
GFR calc non Af Amer: >90 mL/min (ref >90)
Glucose, Bld: 194 mg/dL — ABNORMAL HIGH (ref 70–99)
POTASSIUM: 4.4 meq/L (ref 3.7–5.3)
Sodium: 142 mEq/L (ref 137–147)

## 2014-03-11 LAB — CBC
HCT: 33.1 % — ABNORMAL LOW (ref 36.0–46.0)
Hemoglobin: 10.9 g/dL — ABNORMAL LOW (ref 12.0–15.0)
MCH: 30.7 pg (ref 26.0–34.0)
MCHC: 32.9 g/dL (ref 30.0–36.0)
MCV: 93.2 fL (ref 78.0–100.0)
Platelets: 225 10*3/uL (ref 150–400)
RBC: 3.55 MIL/uL — ABNORMAL LOW (ref 3.87–5.11)
RDW: 12.5 % (ref 11.5–15.5)
WBC: 13.8 10*3/uL — ABNORMAL HIGH (ref 4.0–10.5)

## 2014-03-11 MED ORDER — BISACODYL 5 MG PO TBEC: DELAYED_RELEASE_TABLET | ORAL | Status: DC

## 2014-03-11 MED ORDER — DSS 100 MG PO CAPS: ORAL_CAPSULE | ORAL | Status: AC

## 2014-03-11 MED ORDER — PAROXETINE HCL 10 MG PO TABS: 10.0000 mg | ORAL_TABLET | Freq: Every day | ORAL | Status: DC

## 2014-03-11 MED ORDER — RIVAROXABAN 10 MG PO TABS: ORAL_TABLET | ORAL | Status: DC

## 2014-03-11 MED ORDER — OXYCODONE HCL 5 MG PO TABS: ORAL_TABLET | ORAL | Status: DC

## 2014-03-11 NOTE — Discharge Summary (Signed)
Patient ID: Heidi Webster MRN: [id number] DOB/AGE: 1960/09/02 53 y.o.  Admit date: 03/10/2014 Discharge date: 03/11/2014  Admission Diagnoses:  Principal Problem:   Left knee DJD Active Problems:   Migraines   Hx MRSA infection   Heart murmur   Cardiomyopathy, hypertrophic   GERD (gastroesophageal reflux disease)   Dysrhythmia   Complication of anesthesia   DJD (degenerative joint disease) of knee   Discharge Diagnoses:  Same  Past Medical History  Diagnosis Date  . Migraines   . Hx MRSA infection 2009  . Diverticula of colon 2009  . Left knee DJD   . Heart murmur   . Complication of anesthesia     slow to wake up  . Dysrhythmia     tachycardia  . History of kidney stones   . GERD (gastroesophageal reflux disease)     occ   . Cardiomyopathy, hypertrophic     hypertrophic obstructive cardiomyopathy (Dr. Jacinto Halim)  . Family history of anesthesia complication     son severe nausea and vomiting    Surgeries: Procedure(s): TOTAL KNEE ARTHROPLASTY ARTHROSCOPY KNEE on 03/10/2014   Consultants:    Discharged Condition: Improved  Hospital Course: Heidi Webster is an 53 y.o. female who was admitted 03/10/2014 for operative treatment ofLeft knee DJD. Patient has severe unremitting pain that affects sleep, daily activities, and work/hobbies. After pre-op clearance the patient was taken to the operating room on 03/10/2014 and underwent  Procedure(s): TOTAL KNEE ARTHROPLASTY ARTHROSCOPY KNEE.    Patient was given perioperative antibiotics: Anti-infectives   Start     Dose/Rate Route Frequency Ordered Stop   03/10/14 1700  vancomycin (VANCOCIN) IVPB 1000 mg/200 mL premix     1,000 mg 200 mL/hr over 60 Minutes Intravenous Every 12 hours 03/10/14 1058 03/10/14 1838   03/10/14 0600  ceFAZolin (ANCEF) IVPB 2 g/50 mL premix     2 g 100 mL/hr over 30 Minutes Intravenous On call to O.R. 03/09/14 1414 03/10/14 0825   03/10/14 0600  vancomycin (VANCOCIN) IVPB 1000 mg/200 mL premix      1,000 mg 200 mL/hr over 60 Minutes Intravenous On call to O.R. 03/09/14 1414 03/10/14 0825       Patient was given sequential compression devices, early ambulation, and chemoprophylaxis to prevent DVT.  Patient benefited maximally from hospital stay and there were no complications.    Recent vital signs: Patient Vitals for the past 24 hrs:  BP Temp Temp src Pulse Resp SpO2  03/11/14 1255 91/48 mmHg 98 F (36.7 C) Oral 78 18 99 %  03/11/14 1143 - - - - 18 -  03/11/14 0800 - - - - 18 -  03/11/14 0612 95/47 mmHg 97.9 F (36.6 C) Oral 77 12 97 %  03/11/14 0123 94/60 mmHg - - - - -  03/11/14 0021 90/59 mmHg 97.9 F (36.6 C) Oral 74 12 97 %  03/10/14 2154 93/54 mmHg 97.9 F (36.6 C) Oral 62 12 97 %  03/10/14 1600 - - - - 18 98 %  03/10/14 1507 131/71 mmHg 98 F (36.7 C) Oral 67 20 100 %     Recent laboratory studies:  Recent Labs  03/11/14 0550  WBC 13.8*  HGB 10.9*  HCT 33.1*  PLT 225  NA 142  K 4.4  CL 106  CO2 22  BUN 9  CREATININE 0.59  GLUCOSE 194*  CALCIUM 9.3     Discharge Medications:     Medication List    STOP taking these medications  aspirin EC 81 MG tablet     Co Q 10 100 MG Caps     ibuprofen 200 MG tablet  Commonly known as:  ADVIL,MOTRIN     OVER THE COUNTER MEDICATION      TAKE these medications       acetaminophen 500 MG tablet  Commonly known as:  TYLENOL  Take 1,000 mg by mouth every 6 (six) hours as needed for mild pain or headache.     atorvastatin 40 MG tablet  Commonly known as:  LIPITOR  Take 40 mg by mouth daily.     bisacodyl 5 MG EC tablet  Commonly known as:  DULCOLAX  Take 2 tablets every night with dinner until bowel movement.  LAXITIVE.  Restart if two days since last bowel movement     celecoxib 200 MG capsule  Commonly known as:  CELEBREX  Take 200 mg by mouth daily as needed for mild pain.     DSS 100 MG Caps  1 tab 2 times a day while on narcotics.  STOOL SOFTENER     hyoscyamine 0.375 MG 12 hr  tablet  Commonly known as:  LEVBID  Take 0.375 mg by mouth every 12 (twelve) hours as needed for cramping.     loratadine 10 MG tablet  Commonly known as:  CLARITIN  Take 10 mg by mouth daily.     multivitamin with minerals Tabs tablet  Take 1 tablet by mouth daily. Take 1 tablet Monday - Friday     oxyCODONE 5 MG immediate release tablet  Commonly known as:  Oxy IR/ROXICODONE  1-2 tablets every 4-6 hrs as needed for pain     PARoxetine 10 MG tablet  Commonly known as:  PAXIL  Take 1 tablet (10 mg total) by mouth daily.     potassium gluconate 595 MG Tabs tablet  Take 595 mg by mouth daily.     propranolol ER 120 MG 24 hr capsule  Commonly known as:  INDERAL LA  Take 120 mg by mouth daily.     rivaroxaban 10 MG Tabs tablet  Commonly known as:  XARELTO  1 tab a day for the next 30 days to prevent blood clots     SUMAtriptan 100 MG tablet  Commonly known as:  IMITREX  Take 100 mg by mouth every 2 (two) hours as needed for migraine.        Diagnostic Studies: No results found.  Disposition: 01-Home or Self Care      Discharge Instructions   CPM    Complete by:  As directed   Continuous passive motion machine (CPM):      Use the CPM from 0 to 90 for 6 hours per day.       You may break it up into 2 or 3 sessions per day.      Use CPM for 2 weeks or until you are told to stop.     Call MD / Call 911    Complete by:  As directed   If you experience chest pain or shortness of breath, CALL 911 and be transported to the hospital emergency room.  If you develope a fever above 101 F, pus (white drainage) or increased drainage or redness at the wound, or calf pain, call your surgeon's office.     Change dressing    Complete by:  As directed   Change the dressing daily with sterile 4 x 4 inch gauze dressing and apply TED hose.  You may clean the incision with alcohol prior to redressing.     Constipation Prevention    Complete by:  As directed   Drink plenty of fluids.   Prune juice may be helpful.  You may use a stool softener, such as Colace (over the counter) 100 mg twice a day.  Use MiraLax (over the counter) for constipation as needed.     Diet - low sodium heart healthy    Complete by:  As directed      Discharge instructions    Complete by:  As directed   Total Knee Replacement Care After Refer to this sheet in the next few weeks. These discharge instructions provide you with general information on caring for yourself after you leave the hospital. Your caregiver may also give you specific instructions. Your treatment has been planned according to the most current medical practices available, but unavoidable complications sometimes occur. If you have any problems or questions after discharge, please call your caregiver. Regaining a near full range of motion of your knee within the first 3 to 6 weeks after surgery is critical. HOME CARE INSTRUCTIONS  You may resume a normal diet and activities as directed.  Perform exercises as directed.  Place gray foam block, curve side up under heel at all times except when in CPM or when walking.  DO NOT modify, tear, cut, or change in any way the gray foam block. You will receive physical therapy daily  Take showers instead of baths until informed otherwise.  You may shower on Sunday.  Please wash whole leg including wound with soap and water  Change bandages (dressings)daily It is OK to take over-the-counter tylenol in addition to the oxycodone for pain, discomfort, or fever. Oxycodone is VERY constipating.  Please take stool softener twice a day and laxatives daily until bowels are regular Eat a well-balanced diet.  Avoid lifting or driving until you are instructed otherwise.  Make an appointment to see your caregiver for stitches (suture) or staple removal as directed.  If you have been sent home with a continuous passive motion machine (CPM machine), 0-90 degrees 6 hrs a day   2 hrs a shift SEEK MEDICAL CARE  IF: You have swelling of your calf or leg.  You develop shortness of breath or chest pain.  You have redness, swelling, or increasing pain in the wound.  There is pus or any unusual drainage coming from the surgical site.  You notice a bad smell coming from the surgical site or dressing.  The surgical site breaks open after sutures or staples have been removed.  There is persistent bleeding from the suture or staple line.  You are getting worse or are not improving.  You have any other questions or concerns.  SEEK IMMEDIATE MEDICAL CARE IF:  You have a fever.  You develop a rash.  You have difficulty breathing.  You develop any reaction or side effects to medicines given.  Your knee motion is decreasing rather than improving.  MAKE SURE YOU:  Understand these instructions.  Will watch your condition.  Will get help right away if you are not doing well or get worse.     Do not put a pillow under the knee. Place it under the heel.    Complete by:  As directed   Place gray foam block, curve side up under heel at all times except when in CPM or when walking.  DO NOT modify, tear, cut, or change in any way  the gray foam block.     Increase activity slowly as tolerated    Complete by:  As directed      TED hose    Complete by:  As directed   Use stockings (TED hose) for 2 weeks on both leg(s).  You may remove them at night for sleeping.           Follow-up Information   Follow up with Nilda SimmerWAINER,ROBERT A, MD On 03/25/2014. (appt time 10:30am)    Specialty:  Orthopedic Surgery   Contact information:   [address] [place] [address] [phone]        Signed: Pascal LuxSHEPPERSON,Lanore Renderos J 03/11/2014, 1:29 PM

## 2014-03-11 NOTE — Care Management Note (Signed)
CARE MANAGEMENT NOTE 03/11/2014  Patient:  Heidi Webster, Heidi Webster   Account Number:  [id number]  Date Initiated:  03/11/2014  Documentation initiated by:  Vance Peper  Subjective/Objective Assessment:   53 yr old female s/p left total knee artrhroplasty.     Action/Plan:   patient preoperatively setup with [facility], no changes. Has family support at discharge.   Anticipated DC Date:  03/11/2014   Anticipated DC Plan:  HOME W HOME HEALTH SERVICES      DC Planning Services  CM consult      PAC Choice  DURABLE MEDICAL EQUIPMENT  HOME HEALTH   Choice offered to / List presented to:     DME arranged  [facility]  CPM  3-N-1      DME agency  [employer]     HH arranged  HH-2 PT      HH agency  [facility]   Status of service:  Completed, signed off Medicare Important Message given?   (If response is "NO", the following Medicare IM given date fields will be blank) Date Medicare IM given:   Medicare IM given by:   Date Additional Medicare IM given:   Additional Medicare IM given by:    Discharge Disposition:  HOME W HOME HEALTH SERVICES  Per UR Regulation:  Reviewed for med. necessity/level of care/duration of stay  If discussed at Long Length of Stay Meetings, dates discussed:    Comments:

## 2014-03-11 NOTE — Progress Notes (Signed)
Physical Therapy Treatment Patient Details Name: Nathanial Ranchernne F Behney MRN: [id number] DOB: 09/25/1960 Today's Date: 03/11/2014    History of Present Illness Patient is a 53 yo female admitted 03/10/14 now s/p Lt TKA.  PMH: cardiomyopathy, dysrhythmia, migraines, back pain (arthritis).  Reports she needs to have her Rt knee replaced as well.    PT Comments    Pt was very eager to get up and ambulate. Pt is very positive and motivated to push her self to complete goals. Pt has very good ROM in LLE.  Pt practiced stair training this session. Pt planning to D/C home this afternoon.   Follow Up Recommendations  Home health PT;Supervision/Assistance - 24 hour     Equipment Recommendations  None recommended by PT    Recommendations for Other Services       Precautions / Restrictions Precautions Precautions: Knee Restrictions LLE Weight Bearing: Weight bearing as tolerated    Mobility  Bed Mobility Overal bed mobility: Needs Assistance             General bed mobility comments: pt in chair before and after tx  Transfers Overall transfer level: Needs assistance Equipment used: Rolling walker (2 wheeled) Transfers: Sit to/from Stand Sit to Stand: Supervision         General transfer comment: supervision for balance/safety only.  Ambulation/Gait Ambulation/Gait assistance: Supervision Ambulation Distance (Feet): 300 Feet Assistive device: Rolling walker (2 wheeled) Gait Pattern/deviations: Step-through pattern;Decreased stride length Gait velocity: increasing  Gait velocity interpretation: Below normal speed for age/gender General Gait Details: supervision for safety. cues for extension of knee on LLE. NO knee buckling.    Stairs Stairs: Yes Stairs assistance: Supervision Stair Management: No rails;Backwards Number of Stairs: 1 General stair comments: Pt tolerated stair training well and was able to move up and down stair with minimal cues and at a good speed.    Wheelchair Mobility    Modified Rankin (Stroke Patients Only)       Balance                                    Cognition Arousal/Alertness: Awake/alert Behavior During Therapy: WFL for tasks assessed/performed Overall Cognitive Status: Within Functional Limits for tasks assessed                      Exercises Total Joint Exercises Quad Sets: AROM;Seated;Left;10 reps Heel Slides: AAROM;Seated;Left;10 reps Hip ABduction/ADduction: AAROM;Seated;Left;10 reps Straight Leg Raises: AAROM;Seated;Left;10 reps Long Arc Quad: AAROM;Left;10 reps;Seated    General Comments        Pertinent Vitals/Pain 6/10. RN gave pain meds during tx. Pt repositioned in chair for comfort with footsie roll.    Home Living                      Prior Function            PT Goals (current goals can now be found in the care plan section) Progress towards PT goals: Progressing toward goals    Frequency  7X/week    PT Plan Current plan remains appropriate    Co-evaluation             End of Session Equipment Utilized During Treatment: Gait belt Activity Tolerance: Patient tolerated treatment well Patient left: in chair;with call bell/phone within reach     Time: [id number] PT Time Calculation (min): 23 min  Charges:  G CodesVernell Barrier, SPTA 03/11/2014, 10:27 AM

## 2014-03-13 NOTE — Progress Notes (Signed)
I have reviewed this note and agree with the updated plan of care.  Valerie Fredin [place] [place], PT [phone]  

## 2014-04-15 ENCOUNTER — Ambulatory Visit (HOSPITAL_COMMUNITY)
Admission: RE | Admit: 2014-04-15 | Discharge: 2014-04-15 | Disposition: A | Discharge status: Home / Self Care | Payer: BC Managed Care – PPO | Source: Ambulatory Visit | Attending: Orthopedic Surgery | Admitting: Orthopedic Surgery

## 2014-04-15 DIAGNOSIS — M25569 Pain in unspecified knee: Secondary | ICD-10-CM | POA: Diagnosis not present

## 2014-04-15 DIAGNOSIS — M6281 Muscle weakness (generalized): Secondary | ICD-10-CM | POA: Insufficient documentation

## 2014-04-15 DIAGNOSIS — R269 Unspecified abnormalities of gait and mobility: Secondary | ICD-10-CM | POA: Diagnosis not present

## 2014-04-15 DIAGNOSIS — R29898 Other symptoms and signs involving the musculoskeletal system: Secondary | ICD-10-CM | POA: Insufficient documentation

## 2014-04-15 DIAGNOSIS — IMO0001 Reserved for inherently not codable concepts without codable children: Secondary | ICD-10-CM | POA: Insufficient documentation

## 2014-04-15 DIAGNOSIS — M25669 Stiffness of unspecified knee, not elsewhere classified: Secondary | ICD-10-CM | POA: Insufficient documentation

## 2014-04-15 DIAGNOSIS — R279 Unspecified lack of coordination: Secondary | ICD-10-CM | POA: Diagnosis not present

## 2014-04-15 DIAGNOSIS — R262 Difficulty in walking, not elsewhere classified: Secondary | ICD-10-CM | POA: Insufficient documentation

## 2014-04-15 NOTE — Evaluation (Addendum)
Physical Therapy Evaluation  Patient Details  Name: Heidi Webster MRN: [id number] Date of Birth: [birth year]  Today's Date: 04/15/2014 Time: 1005-1100 PT Time Calculation (min): 55 min     Charges: 1 Evaluation, therEx 1045-1100         Visit#: 1 of 16  Re-eval: 05/12/14 Assessment Diagnosis: Lt TKA, future Rt TKA Surgical Date: 03/10/14 Next MD Visit: 04/22/14 Prior Therapy: yes home health  Authorization: [employer]     Past Medical History:  Past Medical History  Diagnosis Date  . Migraines   . Hx MRSA infection 2009  . Diverticula of colon 2009  . Left knee DJD   . Heart murmur   . Complication of anesthesia     slow to wake up  . Dysrhythmia     tachycardia  . History of kidney stones   . GERD (gastroesophageal reflux disease)     occ   . Cardiomyopathy, hypertrophic     hypertrophic obstructive cardiomyopathy (Dr. Jacinto Halim)  . Family history of anesthesia complication     son severe nausea and vomiting   Past Surgical History:  Past Surgical History  Procedure Laterality Date  . Colon surgery  2009    perforated Diverticula  . Shoulder capsulorrhaphy Right 1991  . Cardiac catheterization  1/15  . Total knee arthroplasty Left 03/10/2014    Procedure: TOTAL KNEE ARTHROPLASTY;  Surgeon: Nilda Simmer, MD;  Location: [facility] OR;  Service: Orthopedics;  Laterality: Left;  . Knee arthroscopy Left 03/10/2014    Procedure: ARTHROSCOPY KNEE;  Surgeon: Nilda Simmer, MD;  Location: [facility] OR;  Service: Orthopedics;  Laterality: Left;    Subjective Symptoms/Limitations Symptoms: Pain,  Pertinent History: Patient is a pediatric PT, Patient had Lt TKA 03/10/14, Rt TKA to be performed Thankgiving,. History of cardiomyopathy. history of knee pain over the last year. X-rays showwed bone on bone.  How long can you stand comfortably?: <73minutes How long can you walk comfortably?: <73minutes Patient Stated Goals: to be able to stand up from the floor withotu UE use. being on feet throughout  a whole day of work, stairs,  Pain Assessment Currently in Pain?: Yes Pain Score: 0-No pain (2 with walking) Pain Location: Knee Pain Orientation: Right;Left Pain Type: Surgical pain;Chronic pain Pain Radiating Towards: Lt leg: anterior inferior patellar tendon, Rt medial knee with shooting pain down medial tibia Pain Onset: More than a month ago Pain Frequency: Intermittent Pain Relieving Factors: non-weightbearign, ice,  Effect of Pain on Daily Activities: "Lt knee wears out where I just have to site do to fatigue."   Cognition/Observation Observation/Other Assessments Observations: Gait: limited tibial internal rotation, excessive foot flat, limited hip mobility Other Assessments: FOTO: 51% limited  Sensation/Coordination/Flexibility/Functional Tests Flexibility Obers: Positive 90/90: Negative Functional Tests Functional Tests: piriformis test positive Functional Tests: Ely's test positive for Lt knee tightness  Assessment RLE AROM (degrees) Right Ankle Dorsiflexion: 15 RLE Strength Right Hip Flexion: 4/5 Right Hip Extension: 4/5 Right Hip ABduction: 3/5 Right Knee Flexion: 4/5 Right Knee Extension: 5/5 Right Ankle Dorsiflexion: 4/5 LLE AROM (degrees) Left Hip External Rotation : 50 Left Hip Internal Rotation : 40 Left Knee Extension: -9 Left Knee Flexion: 124 Left Ankle Dorsiflexion: 10 LLE Strength Left Hip Flexion: 4/5 Left Hip Extension: 4/5 Left Hip ABduction: 3+/5 Left Knee Flexion: 4/5 Left Knee Extension: 5/5 Left Ankle Dorsiflexion: 4/5  Exercise/Treatments Piriformis stretch 10x3" Hamstring stretch 10x3" Groin stretch 10x3" Calf stretch 10x3" Hip flexor stretch 10x3"  Physical Therapy Assessment and Plan PT Assessment  and Plan Clinical Impression Statement: Patient displays Lt knee stiffness and pain follwoign Lt TKA resultign in abnormal gait and dififculty walking. patient will beenfit from skilled phsyical therapy to increase Lt LE mobility  and prepare patient or Rt knee replacement. 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 Pt will benefit from skilled therapeutic intervention in order to improve on the following deficits: Abnormal gait;Decreased activity tolerance;Decreased balance;Difficulty walking;Decreased coordination;Decreased strength;Decreased range of motion;Increased fascial restricitons;Decreased mobility;Increased muscle spasms;Impaired flexibility;Pain;Improper body mechanics Rehab Potential: Good Clinical Impairments Affecting Rehab Potential: highly motivated PT Frequency: Min 2X/week PT Duration: 8 weeks PT Treatment/Interventions: Gait training;Stair training;Functional mobility training;Therapeutic activities;Therapeutic exercise;Balance training;Neuromuscular re-education;Modalities;Manual techniques;Patient/family education PT Plan: PT to iniitally focus on reestablishing full knee AROM, and normalize gait, as mobility improves focus to shift towards improving LE strength.     Goals PT Short Term Goals Time to Complete Short Term Goals: 4 weeks PT Short Term Goal 1: patient will be able to extend knee to greater than -5 degrees from full extension to walk with normal stride length PT Short Term Goal 2: Patient will be able to demonstrate ankle dorsiflexion >17 degrees so patient can ambulate without an early heel rise.  PT Short Term Goal 3: Patient will demosntrate a negative ely's test and negative ober's test bilaterally indicating decreased strain on knee.  PT Long Term Goals Time to Complete Long Term Goals: 8  weeks PT Long Term Goal 1: Patient will demonstrate increased hamstring strength of 5/5 MMT to indicate increased knee stability tso patient can stand up from the floor withtou UE assist PT Long Term Goal 2: Patient will demostrate increased glut med/max strength of 4+/5MMT Problem List Patient Active Problem List   Diagnosis Date Noted  . Stiffness of joint, not elsewhere classified, lower leg 04/15/2014  . Difficulty walking 04/15/2014  . Weakness of right lower extremity 04/15/2014  . DJD (degenerative joint disease) of knee 03/10/2014  . Cardiomyopathy, hypertrophic   . GERD (gastroesophageal reflux disease)   . Dysrhythmia   . Complication of anesthesia   . Diverticula of colon   . Kidney stone   . Left knee DJD   . Heart murmur   . Migraines   . Hx MRSA infection     PT - End of Session Activity Tolerance: Patient tolerated treatment well General Behavior During Therapy: WFL for tasks assessed/performed PT Plan of Care PT Home Exercise Plan: 3D hip excursion, 3 way hamstring, 3 way, hip flexor,, 3 way calf, prone quadraceps, and 2 way groin stretch  GP    Emillee Talsma R 04/15/2014, 11:02 AM  Physician Documentation Your signature is required to indicate approval of the treatment plan as stated above.  Please sign and either send electronically or make a copy of this report for your files and return this physician signed original.   Please mark one 1.__approve of plan  2. ___approve of plan with the following conditions.   ______________________________  _____________________ Physician Signature                                                                                                             Date

## 2014-04-17 ENCOUNTER — Ambulatory Visit (HOSPITAL_COMMUNITY)
Admission: RE | Admit: 2014-04-17 | Discharge: 2014-04-17 | Disposition: A | Discharge status: Home / Self Care | Payer: BC Managed Care – PPO | Source: Ambulatory Visit | Attending: Orthopedic Surgery | Admitting: Orthopedic Surgery

## 2014-04-17 DIAGNOSIS — IMO0001 Reserved for inherently not codable concepts without codable children: Secondary | ICD-10-CM | POA: Diagnosis not present

## 2014-04-17 NOTE — Progress Notes (Signed)
Physical Therapy Treatment Patient Details  Name: Heidi Webster MRN: [id number] Date of Birth: 04/06/1961  Today's Date: 04/17/2014 Time: [id number] PT Time Calculation (min): 50 min Charge: TE 9629-5284, [id number], Ice [phone]  Visit#: 2 of 16  Re-eval: 05/12/14 Assessment Diagnosis: Lt TKA, future Rt TKA Surgical Date: 03/10/14 Next MD Visit: Thurston HoleWainer 04/22/14 Prior Therapy: yes home health  Authorization: [employer]  Authorization Time Period:    Authorization Visit#:   of     Subjective: Symptoms/Limitations Symptoms: Pain scale Lt knee 2/10, Rt knee 4/10.  Pt reports compliance with HEP with question, she feels weather has a lot to do with pain. Pain Assessment Currently in Pain?: Yes Pain Score: 2  Pain Location: Knee Pain Orientation: Left;Right  Objective:   Exercise/Treatments Stretches Active Hamstring Stretch: 3 reps;30 seconds;Limitations Active Hamstring Stretch Limitations: 3 direciotns on 14in step Quad Stretch: 3 reps;30 seconds Hip Flexor Stretch: 3 reps;30 seconds;Limitations Hip Flexor Stretch Limitations: 14in hip flexion and groin  Gastroc Stretch: 3 reps;30 seconds;Limitations Gastroc Stretch Limitations: slant board Aerobic Stationary Bike: 8' seat 5 for ROM Standing Terminal Knee Extension: Limitations Terminal Knee Extension Limitations: Begin next session Supine Quad Sets: Left;10 reps Short Arc Quad Sets: 15 reps Terminal Knee Extension: 10 reps   Modalities Modalities: Cryotherapy Cryotherapy Number Minutes Cryotherapy: 10 Minutes Cryotherapy Location: Knee Type of Cryotherapy: Ice pack  Physical Therapy Assessment and Plan PT Assessment and Plan Clinical Impression Statement: Began PT POC with focus on hip mobilty and knee AROM with quad strengthening exercises for knee extension..  Pt able to demonstrate appropriate form with all stretches and exercises with minimal cueing.  AROM -9-125 today.  Ended session with ice for pain and edema control.    PT Plan: Focus on knee extension exercises, begin standing TKE next session and resume 3D hip excursion to improve gluteal strengthening and hip mobility.  Once knee extension improve focys shift toward LE strengthening.    Goals PT Short Term Goals PT Short Term Goal 1: patient will be able to extend knee to greater than -5 degrees from full extension to walk with normal stride length PT Short Term Goal 1 - Progress: Progressing toward goal PT Short Term Goal 2: Patient will be able to demonstrate ankle dorsiflexion >17 degrees so patient can ambulate without an early heel rise.  PT Short Term Goal 2 - Progress: Progressing toward goal PT Short Term Goal 3: Patient will demosntrate a negative ely's test and negative ober's test bilaterally indicating decreased strain on knee.  PT Short Term Goal 3 - Progress: Progressing toward goal PT Long Term Goals PT Long Term Goal 1: Patient will demonstrate increased hamstring strength of 5/5 MMT to indicate increased knee stability tso patient can stand up from the floor withtou UE assist PT Long Term Goal 2: Patient will demostrate increased glut med/max strength of 4+/5MMT  Problem List Patient Active Problem List   Diagnosis Date Noted  . Stiffness of joint, not elsewhere classified, lower leg 04/15/2014  . Difficulty walking 04/15/2014  . Weakness of right lower extremity 04/15/2014  . DJD (degenerative joint disease) of knee 03/10/2014  . Cardiomyopathy, hypertrophic   . GERD (gastroesophageal reflux disease)   . Dysrhythmia   . Complication of anesthesia   . Diverticula of colon   . Kidney stone   . Left knee DJD   . Heart murmur   . Migraines   . Hx MRSA infection     PT - End of Session Activity Tolerance: Patient tolerated  treatment well General Behavior During Therapy: [facility] for tasks assessed/performed  GP    Juel Burrow 04/17/2014, 9:29 AM

## 2014-04-23 ENCOUNTER — Ambulatory Visit (HOSPITAL_COMMUNITY): Payer: BC Managed Care – PPO | Admitting: Physical Therapy

## 2014-04-29 ENCOUNTER — Ambulatory Visit (HOSPITAL_COMMUNITY): Payer: BC Managed Care – PPO

## 2014-05-01 ENCOUNTER — Ambulatory Visit (HOSPITAL_COMMUNITY): Payer: BC Managed Care – PPO | Admitting: Physical Therapy

## 2014-05-06 ENCOUNTER — Ambulatory Visit (HOSPITAL_COMMUNITY): Payer: BC Managed Care – PPO | Admitting: Physical Therapy

## 2014-05-08 ENCOUNTER — Ambulatory Visit (HOSPITAL_COMMUNITY): Payer: BC Managed Care – PPO | Admitting: Physical Therapy

## 2014-05-13 ENCOUNTER — Ambulatory Visit (HOSPITAL_COMMUNITY): Payer: BC Managed Care – PPO | Admitting: Physical Therapy

## 2014-05-15 ENCOUNTER — Ambulatory Visit (HOSPITAL_COMMUNITY): Payer: BC Managed Care – PPO | Admitting: Physical Therapy

## 2014-05-20 NOTE — Addendum Note (Signed)
Encounter addended by: Doyne Keel, PT on: 05/20/2014  9:19 AM<BR>     Documentation filed: Episodes, Letters

## 2014-07-09 ENCOUNTER — Encounter (HOSPITAL_COMMUNITY): Payer: Self-pay | Admitting: Physician Assistant

## 2014-07-09 DIAGNOSIS — M1711 Unilateral primary osteoarthritis, right knee: Secondary | ICD-10-CM | POA: Diagnosis present

## 2014-07-09 NOTE — H&P (Addendum)
TOTAL KNEE ADMISSION H&P  Patient is being admitted for right total knee arthroplasty.  Subjective:  Chief Complaint:right knee pain.  HPI: Heidi Webster, 53 y.o. female, has a history of pain and functional disability in the right knee due to arthritis and has failed non-surgical conservative treatments for greater than 12 weeks to includeNSAID's and/or analgesics, corticosteriod injections, viscosupplementation injections, flexibility and strengthening excercises, supervised PT with diminished ADL's post treatment, use of assistive devices, weight reduction as appropriate and activity modification.  Onset of symptoms was gradual, starting 8 years ago with gradually worsening course since that time. The patient noted no past surgery on the right knee(s).  Patient currently rates pain in the right knee(s) at 10 out of 10 with activity. Patient has night pain, worsening of pain with activity and weight bearing, pain that interferes with activities of daily living, crepitus and joint swelling.  Patient has evidence of subchondral sclerosis, joint subluxation and joint space narrowing by imaging studies. There is no active infection.  Patient Active Problem List   Diagnosis Date Noted  . Primary localized osteoarthritis of right knee   . Stiffness of joint, not elsewhere classified, lower leg 04/15/2014  . Difficulty walking 04/15/2014  . Weakness of right lower extremity 04/15/2014  . DJD (degenerative joint disease) of knee 03/10/2014  . Cardiomyopathy, hypertrophic   . GERD (gastroesophageal reflux disease)   . Dysrhythmia   . Complication of anesthesia   . Diverticula of colon   . Kidney stone   . Left knee DJD   . Heart murmur   . Migraines   . Hx MRSA infection    Past Medical History  Diagnosis Date  . Migraines   . Hx MRSA infection 2009  . Diverticula of colon 2009  . Left knee DJD   . Heart murmur   . Complication of anesthesia     slow to wake up  . Dysrhythmia    tachycardia  . History of kidney stones   . GERD (gastroesophageal reflux disease)     occ   . Cardiomyopathy, hypertrophic     hypertrophic obstructive cardiomyopathy (Dr. Jacinto HalimGanji)  . Family history of anesthesia complication     son severe nausea and vomiting  . Primary localized osteoarthritis of right knee     Past Surgical History  Procedure Laterality Date  . Colon surgery  2009    perforated Diverticula  . Shoulder capsulorrhaphy Right 1991  . Cardiac catheterization  1/15  . Total knee arthroplasty Left 03/10/2014    Procedure: TOTAL KNEE ARTHROPLASTY;  Surgeon: Nilda Simmerobert A Wainer, MD;  [facility];  Service: Orthopedics;  Laterality: Left;  . Knee arthroscopy Left 03/10/2014    Procedure: ARTHROSCOPY KNEE;  Surgeon: Nilda Simmerobert A Wainer, MD;  [facility];  Service: Orthopedics;  Laterality: Left;    No prescriptions prior to admission   Allergies  Allergen Reactions  . Augmentin [Amoxicillin-Pot Clavulanate] Rash  . Tape Rash    bandaides   No current facility-administered medications for this encounter. Current outpatient prescriptions: acetaminophen (TYLENOL) 500 MG tablet, Take 1,000 mg by mouth every 6 (six) hours as needed for mild pain or headache. , Disp: , Rfl: ;  atorvastatin (LIPITOR) 40 MG tablet, Take 40 mg by mouth daily., Disp: , Rfl: ;  bisacodyl (DULCOLAX) 5 MG EC tablet, Take 2 tablets every night with dinner until bowel movement.  LAXITIVE.  Restart if two days since last bowel movement, Disp: 30 tablet, Rfl: 0 celecoxib (CELEBREX) 200  MG capsule, Take 200 mg by mouth daily as needed for mild pain., Disp: , Rfl: ;  docusate sodium 100 MG CAPS, 1 tab 2 times a day while on narcotics.  STOOL SOFTENER, Disp: 60 capsule, Rfl: 0;  hyoscyamine (LEVBID) 0.375 MG 12 hr tablet, Take 0.375 mg by mouth every 12 (twelve) hours as needed for cramping. , Disp: , Rfl: ;  loratadine (CLARITIN) 10 MG tablet, Take 10 mg by mouth daily., Disp: , Rfl:  Multiple Vitamin (MULTIVITAMIN  WITH MINERALS) TABS, Take 1 tablet by mouth daily. Take 1 tablet Monday - Friday, Disp: , Rfl: ;  oxyCODONE (OXY IR/ROXICODONE) 5 MG immediate release tablet, 1-2 tablets every 4-6 hrs as needed for pain, Disp: 100 tablet, Rfl: 0;  PARoxetine (PAXIL) 10 MG tablet, Take 1 tablet (10 mg total) by mouth daily., Disp: 30 tablet, Rfl: 3;  potassium gluconate 595 MG TABS tablet, Take 595 mg by mouth daily., Disp: , Rfl:  propranolol ER (INDERAL LA) 120 MG 24 hr capsule, Take 120 mg by mouth daily., Disp: , Rfl: ;  rivaroxaban (XARELTO) 10 MG TABS tablet, 1 tab a day for the next 30 days to prevent blood clots, Disp: 30 tablet, Rfl: 0;  SUMAtriptan (IMITREX) 100 MG tablet, Take 100 mg by mouth every 2 (two) hours as needed for migraine. , Disp: , Rfl:  History  Substance Use Topics  . Smoking status: Never Smoker   . Smokeless tobacco: Never Used  . Alcohol Use: Yes     Comment: 1 drink a week    Family History  Problem Relation Age of Onset  . Diabetes Mother   . Heart disease Mother     murmur and cardiomyopathy  . Heart disease Father   . Arthritis-Osteo Father      Review of Systems  Constitutional: Negative.   HENT: Negative.   Respiratory: Negative.   Cardiovascular: Negative.   Gastrointestinal: Negative.   Genitourinary: Negative.   Musculoskeletal: Positive for back pain and joint pain.       Right knee  Skin: Negative.   Neurological: Negative.   Endo/Heme/Allergies: Negative.   Psychiatric/Behavioral: Negative.     Objective:  Physical Exam  Constitutional: Heidi Webster is oriented to person, place, and time. Heidi Webster appears well-developed and well-nourished.  HENT:  Head: Normocephalic and atraumatic.  Eyes: Conjunctivae and EOM are normal. Pupils are equal, round, and reactive to light.  Neck: Neck supple.  Cardiovascular: Normal rate.  Exam reveals no gallop and no friction rub.   Murmur heard. Respiratory: Effort normal.  GI: Soft.  Genitourinary:  Not pertinent to current  symptomatology therefore not examined.  Musculoskeletal:  Exam of the right knee reveals 1+ crepitation 1+ synovitis, range of motion is 0-120 degrees with diffuse pain, knee is stable with normal patella tracking. Vascular exam: pulses 2+ and symmetric. Examination of her left knee reveals well healed incision no swelling or pain, full range of motion knee is stable  Neurological: Heidi Webster is alert and oriented to person, place, and time.  Skin: Skin is warm and dry.  Psychiatric: Heidi Webster has a normal mood and affect. Her behavior is normal.    Vital signs in last 24 hours: Pulse Rate:  [90] 90 (11/11 1300) BP: (108)/(76) 108/76 mmHg (11/11 1300) SpO2:  [97 %] 97 % (11/11 1300) Weight:  [71.215 kg (157 lb)] 71.215 kg (157 lb) (11/11 1300)  Labs:   Estimated body mass index is 29.68 kg/(m^2) as calculated from the following:   Height as  of this encounter: 5\' 1"  (1.549 m).   Weight as of this encounter: 71.215 kg (157 lb).   Imaging Review Plain radiographs demonstrate severe degenerative joint disease of the right knee(s). The overall alignment issignificant varus. The bone quality appears to be good for age and reported activity level.  Assessment/Plan:  End stage arthritis, right knee Active Problems:   Primary localized osteoarthritis of right knee    The patient history, physical examination, clinical judgment of the provider and imaging studies are consistent with end stage degenerative joint disease of the right knee(s) and total knee arthroplasty is deemed medically necessary. The treatment options including medical management, injection therapy arthroscopy and arthroplasty were discussed at length. The risks and benefits of total knee arthroplasty were presented and reviewed. The risks due to aseptic loosening, infection, stiffness, patella tracking problems, thromboembolic complications and other imponderables were discussed. The patient acknowledged the explanation, agreed to proceed  with the plan and consent was signed. Patient is being admitted for inpatient treatment for surgery, pain control, PT, OT, prophylactic antibiotics, VTE prophylaxis, progressive ambulation and ADL's and discharge planning. The patient is planning to be discharged home with home health services  Daniel Johndrow A. Gwinda Passe Physician Assistant [facility] [phone]  07/09/2014, 2:14 PM

## 2014-07-11 ENCOUNTER — Encounter (HOSPITAL_COMMUNITY)
Admission: RE | Admit: 2014-07-11 | Discharge: 2014-07-11 | Disposition: A | Discharge status: Home / Self Care | Payer: BC Managed Care – PPO | Source: Ambulatory Visit | Attending: Orthopedic Surgery | Admitting: Orthopedic Surgery

## 2014-07-11 ENCOUNTER — Encounter (HOSPITAL_COMMUNITY): Payer: Self-pay

## 2014-07-11 DIAGNOSIS — Z01812 Encounter for preprocedural laboratory examination: Secondary | ICD-10-CM | POA: Insufficient documentation

## 2014-07-11 DIAGNOSIS — M1711 Unilateral primary osteoarthritis, right knee: Secondary | ICD-10-CM | POA: Insufficient documentation

## 2014-07-11 HISTORY — DX: Anxiety disorder, unspecified: F41.9

## 2014-07-11 LAB — COMPREHENSIVE METABOLIC PANEL
ALT: 25 U/L (ref 0–35)
AST: 26 U/L (ref 0–37)
Albumin: 3.9 g/dL (ref 3.5–5.2)
Alkaline Phosphatase: 88 U/L (ref 39–117)
Anion gap: 11 (ref 5–15)
BUN: 10 mg/dL (ref 6–23)
CO2: 25 meq/L (ref 19–32)
CREATININE: 0.69 mg/dL (ref 0.50–1.10)
Calcium: 9.7 mg/dL (ref 8.4–10.5)
Chloride: 105 mEq/L (ref 96–112)
Glucose, Bld: 89 mg/dL (ref 70–99)
Potassium: 4.2 mEq/L (ref 3.7–5.3)
Sodium: 141 mEq/L (ref 137–147)
TOTAL PROTEIN: 6.8 g/dL (ref 6.0–8.3)
Total Bilirubin: 0.5 mg/dL (ref 0.3–1.2)

## 2014-07-11 LAB — CBC WITH DIFFERENTIAL/PLATELET
BASOS ABS: 0 10*3/uL (ref 0.0–0.1)
Basophils Relative: 1 % (ref 0–1)
EOS ABS: 0.2 10*3/uL (ref 0.0–0.7)
Eosinophils Relative: 2 % (ref 0–5)
HEMATOCRIT: 41.4 % (ref 36.0–46.0)
Hemoglobin: 13.7 g/dL (ref 12.0–15.0)
LYMPHS PCT: 26 % (ref 12–46)
Lymphs Abs: 1.6 10*3/uL (ref 0.7–4.0)
MCH: 29.8 pg (ref 26.0–34.0)
MCHC: 33.1 g/dL (ref 30.0–36.0)
MCV: 90.2 fL (ref 78.0–100.0)
MONO ABS: 0.4 10*3/uL (ref 0.1–1.0)
Monocytes Relative: 7 % (ref 3–12)
Neutro Abs: 4 10*3/uL (ref 1.7–7.7)
Neutrophils Relative %: 64 % (ref 43–77)
Platelets: 232 10*3/uL (ref 150–400)
RBC: 4.59 MIL/uL (ref 3.87–5.11)
RDW: 13.4 % (ref 11.5–15.5)
WBC: 6.2 10*3/uL (ref 4.0–10.5)

## 2014-07-11 LAB — URINALYSIS, ROUTINE W REFLEX MICROSCOPIC
BILIRUBIN URINE: NEGATIVE
GLUCOSE, UA: NEGATIVE mg/dL
Hgb urine dipstick: NEGATIVE
KETONES UR: NEGATIVE mg/dL
Leukocytes, UA: NEGATIVE
Nitrite: NEGATIVE
PH: 6 (ref 5.0–8.0)
Protein, ur: NEGATIVE mg/dL
Specific Gravity, Urine: 1.015 (ref 1.005–1.030)
Urobilinogen, UA: 0.2 mg/dL (ref 0.0–1.0)

## 2014-07-11 LAB — PROTIME-INR
INR: 1 (ref 0.00–1.49)
Prothrombin Time: 13.3 seconds (ref 11.6–15.2)

## 2014-07-11 LAB — TYPE AND SCREEN
ABO/RH(D): O POS
Antibody Screen: NEGATIVE

## 2014-07-11 LAB — SURGICAL PCR SCREEN
MRSA, PCR: NEGATIVE
Staphylococcus aureus: NEGATIVE

## 2014-07-11 LAB — APTT: aPTT: 32 seconds (ref 24–37)

## 2014-07-11 NOTE — Progress Notes (Signed)
req'd notes any tests, clearance for surgery from visit 8/15 from dr Jacinto Halim

## 2014-07-11 NOTE — Pre-Procedure Instructions (Signed)
Heidi Webster  07/11/2014   Your procedure is scheduled on:  07/21/14  Report to [facility] cone short stay admitting at *530AM.  Call this number if you have problems the morning of surgery: [phone]   Remember:   Do not eat food or drink liquids after midnight.   Take these medicines the morning of surgery with A SIP OF WATER: pain,imitrex if needed, paxil      STOP all herbel meds, nsaids (aleve,naproxen,advil,ibuprofen) 5 days prior to surgery starting11/18/15 including aspirin, vitmins, celebrex    Do not wear jewelry, make-up or nail polish.  Do not wear lotions, powders, or perfumes. You may wear deodorant.  Do not shave 48 hours prior to surgery. Men may shave face and neck.  Do not bring valuables to the hospital.  [facility] is not responsible                  for any belongings or valuables.               Contacts, dentures or bridgework may not be worn into surgery.  Leave suitcase in the car. After surgery it may be brought to your room.  For patients admitted to the hospital, discharge time is determined by your                treatment team.               Patients discharged the day of surgery will not be allowed to drive  home.  Name and phone number of your driver:   Special Instructions:  Special Instructions: [place] - Preparing for Surgery  Before surgery, you can play an important role.  Because skin is not sterile, your skin needs to be as free of germs as possible.  You can reduce the number of germs on you skin by washing with CHG (chlorahexidine gluconate) soap before surgery.  CHG is an antiseptic cleaner which kills germs and bonds with the skin to continue killing germs even after washing.  Please DO NOT use if you have an allergy to CHG or antibacterial soaps.  If your skin becomes reddened/irritated stop using the CHG and inform your nurse when you arrive at Short Stay.  Do not shave (including legs and underarms) for at least 48 hours prior to the  first CHG shower.  You may shave your face.  Please follow these instructions carefully:   1.  Shower with CHG Soap the night before surgery and the morning of Surgery.  2.  If you choose to wash your hair, wash your hair first as usual with your normal shampoo.  3.  After you shampoo, rinse your hair and body thoroughly to remove the Shampoo.  4.  Use CHG as you would any other liquid soap.  You can apply chg directly  to the skin and wash gently with scrungie or a clean washcloth.  5.  Apply the CHG Soap to your body ONLY FROM THE NECK DOWN.  Do not use on open wounds or open sores.  Avoid contact with your eyes ears, mouth and genitals (private parts).  Wash genitals (private parts)       with your normal soap.  6.  Wash thoroughly, paying special attention to the area where your surgery will be performed.  7.  Thoroughly rinse your body with warm water from the neck down.  8.  DO NOT shower/wash with your normal soap after using and rinsing off the CHG  Soap.  9.  Pat yourself dry with a clean towel.            10.  Wear clean pajamas.            11.  Place clean sheets on your bed the night of your first shower and do not sleep with pets.  Day of Surgery  Do not apply any lotions/deodorants the morning of surgery.  Please wear clean clothes to the hospital/surgery center.   Please read over the following fact sheets that you were given: Pain Booklet, Coughing and Deep Breathing, Blood Transfusion Information, Total Joint Packet, MRSA Information and Surgical Site Infection Prevention

## 2014-07-12 LAB — URINE CULTURE: Colony Count: 10000

## 2014-07-14 NOTE — Progress Notes (Signed)
Anesthesia Chart Review: Patient is a 53 year old female scheduled for right TKR on 07/21/14 by Dr. Thurston Hole.   History includes non-smoker, migraines, nephrolithiasis, heart murmur, hypertrophic obstructive cardiomyopathy, tachycardia, sigmoid colon resection due to diverticulitis with abscess 03/2008, MRSA '09, left TKA 03/10/14. PCP is listed as Dr. Catalina Pizza. Cardiologist is Dr. Jacinto Halim. He cleared her for surgery with "low" CV risk, but recommended "Please keep her well hydrated due to hypertrophic obstructive cardiomyopathy periprocedurely."   EKG on 09/03/13 showed NSR, biatrial enlargement, inferior infarct (age undetermined), possible anterior infarct (age undetermined). Lateral ST/T wave abnormality, consider ischemia, prolonged QT.   Cardiac MRI on 10/09/13 showed: 1. Today's study demonstrates definitive evidence of hypertrophic obstructive cardiomyopathy (HOCM). Specifically, there is marked asymmetric septal hypertrophy, evidence of infiltrative cardiomyopathy on delayed enhancement imaging, dynamic outflow tract obstruction which is severely worsened by [facility] resulting in a peak systolic gradient of greater than 121 mmHg in the left ventricular outflow tract as well as moderate mitral regurgitation.  2. Normal resting left ventricular wall motion and function with calculated ejection fraction of 60% percent.  3. Mild left atrial dilatation.  Cardiac cath on 09/03/13 showed: Hemodynamic data:  Left ventricular apex pressure was 170/19 with end-diastolic pressure of 25 mm mercury. Left ventricle are base pressure was 94/14 with LVEDP of 21 mm mercury. There was a peak to peak pressure intraventricular pressure gradient of 76 mmHg. Aortic pressure was 99/65 with a mean of 83 mm mercury. There was no pressure gradient across the aortic valve. The findings are suggestive of intraventricular pressure gradient and post PVC elevation and pressure gradient suggest LVOT obstruction.  Left ventricle:  Performed in the RAO projection revealed LVEF of 65-70%. There was No MR. No wall motion abnormality.  Right coronary artery: The vessel is smooth, normal, Dominant. Large PDA and large PL branches.  Left main coronary artery is large and normal.  Circumflex coronary artery: A large vessel giving origin to a large obtuse marginal 1. It is smooth and normal.  Ramus intermediate: Small to moderate vessel, smooth and normal.  LAD: LAD gives origin to a large diagonal-1. LAD has mild luminal irregularities. Ostial LAD has eccentric 20% stenosis. Ostial diagonal have minimal disease.  Rec: Medical therapy with aggressive risk factor reduction. Patient can undergo surgery with acceptable cardiovascular risk. Patient scheduled for knee replacement. She will need to avoid hypotension. 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  Echocardiogram on 07/18/2013 [facility] CV) showed left ventricular cavity is normal in size. Moderate concentric hypertrophy. Moderate septal notching. Diastolic filling with pseudonormal pattern and both elevated left atrial and end-diastolic pressure. Normal global wall motion. Normal systolic global function. Calculated EF 56%. Doppler evidence of grade 2 (pseudonormal) diastolic dysfunction. Left atrial cavity is mildly dilated. Patent foramen ovale or small secundum ASD is present. Mild aneurysmal motion of the intra-atrial septum. Moderate mitral regurgitation. Mild mitral valve leaflet thickening. Systolic anterior motion of the anterior mitral leaflet with resulting LVOT obstruction with a peak pressure gradient of greater than 25 mmHg and moderate posteriorly directed MR. Consider hypertropic cardiomyopathy in the differential diagnosis.  Nuclear stress test on 07/19/13 [facility] CV) showed: Perfusion imaging studies demonstrate a moderate sized mild to moderate inferior and inferolateral ischemia. Left ventricle and the stress images was dilated  although the TID index did not reach significance at 1.2. Dynamic images revealed depressed left ventricle systolic function with global hypokinesis; left ventricular ejection fraction was estimated to be 30%. This is a high risk study.  Recommend further cardiac workup including cardiac catheterization if clinically indicated.  CXR on 07/15/13 showed no acute cardiopulmonary disease.  Preoperative labs noted.   Patient with extensive cardiac testing since 06/2013 and diagnosed with HOCM. She has been cleared by her cardiologist but will need close attention to fluid status.She underwent similar procedure on the left within the past six months. If no acute changes then I anticipate that she can proceed as planned.  Velna Ochsllison Ieshia Hatcher, PA-C [facility] Short Stay [facility]/Anesthesiology Phone [phone] 07/14/2014 11:20 AM

## 2014-07-20 MED ORDER — POVIDONE-IODINE 7.5 % EX SOLN
Freq: Once | CUTANEOUS | Status: DC
  Filled 2014-07-20: qty 118

## 2014-07-20 MED ORDER — CHLORHEXIDINE GLUCONATE 4 % EX LIQD
60.0000 mL | Freq: Once | CUTANEOUS | Status: DC
  Filled 2014-07-20: qty 60

## 2014-07-20 MED ORDER — VANCOMYCIN HCL IN DEXTROSE 1-5 GM/200ML-% IV SOLN
1000.0000 mg | INTRAVENOUS | Status: DC
  Filled 2014-07-20: qty 200

## 2014-07-20 MED ORDER — LACTATED RINGERS IV SOLN: INTRAVENOUS | Status: DC

## 2014-07-21 ENCOUNTER — Inpatient Hospital Stay (HOSPITAL_COMMUNITY): Payer: BC Managed Care – PPO | Admitting: Certified Registered Nurse Anesthetist

## 2014-07-21 ENCOUNTER — Inpatient Hospital Stay (HOSPITAL_COMMUNITY): Payer: BC Managed Care – PPO | Admitting: Vascular Surgery

## 2014-07-21 ENCOUNTER — Encounter (HOSPITAL_COMMUNITY)
Admission: RE | Disposition: A | Discharge status: Home Health | Payer: Self-pay | Source: Ambulatory Visit | Attending: Orthopedic Surgery

## 2014-07-21 ENCOUNTER — Encounter (HOSPITAL_COMMUNITY): Payer: Self-pay | Admitting: *Deleted

## 2014-07-21 ENCOUNTER — Inpatient Hospital Stay (HOSPITAL_COMMUNITY)
Admission: RE | Admit: 2014-07-21 | Discharge: 2014-07-22 | DRG: 470 | Disposition: A | Discharge status: Home Health | Payer: BC Managed Care – PPO | Source: Ambulatory Visit | Attending: Orthopedic Surgery | Admitting: Orthopedic Surgery

## 2014-07-21 DIAGNOSIS — M1711 Unilateral primary osteoarthritis, right knee: Secondary | ICD-10-CM | POA: Diagnosis present

## 2014-07-21 DIAGNOSIS — M25561 Pain in right knee: Secondary | ICD-10-CM | POA: Diagnosis present

## 2014-07-21 DIAGNOSIS — I421 Obstructive hypertrophic cardiomyopathy: Secondary | ICD-10-CM | POA: Diagnosis present

## 2014-07-21 DIAGNOSIS — Z79899 Other long term (current) drug therapy: Secondary | ICD-10-CM | POA: Diagnosis not present

## 2014-07-21 DIAGNOSIS — Z79891 Long term (current) use of opiate analgesic: Secondary | ICD-10-CM | POA: Diagnosis not present

## 2014-07-21 DIAGNOSIS — F419 Anxiety disorder, unspecified: Secondary | ICD-10-CM | POA: Diagnosis present

## 2014-07-21 DIAGNOSIS — K219 Gastro-esophageal reflux disease without esophagitis: Secondary | ICD-10-CM | POA: Diagnosis present

## 2014-07-21 DIAGNOSIS — I429 Cardiomyopathy, unspecified: Secondary | ICD-10-CM | POA: Diagnosis present

## 2014-07-21 DIAGNOSIS — I499 Cardiac arrhythmia, unspecified: Secondary | ICD-10-CM | POA: Diagnosis present

## 2014-07-21 DIAGNOSIS — I422 Other hypertrophic cardiomyopathy: Secondary | ICD-10-CM

## 2014-07-21 DIAGNOSIS — Z8614 Personal history of Methicillin resistant Staphylococcus aureus infection: Secondary | ICD-10-CM | POA: Diagnosis not present

## 2014-07-21 DIAGNOSIS — M1710 Unilateral primary osteoarthritis, unspecified knee: Secondary | ICD-10-CM | POA: Diagnosis present

## 2014-07-21 DIAGNOSIS — R011 Cardiac murmur, unspecified: Secondary | ICD-10-CM | POA: Diagnosis present

## 2014-07-21 DIAGNOSIS — M179 Osteoarthritis of knee, unspecified: Secondary | ICD-10-CM | POA: Diagnosis present

## 2014-07-21 HISTORY — DX: Unilateral primary osteoarthritis, right knee: M17.11

## 2014-07-21 HISTORY — PX: TOTAL KNEE ARTHROPLASTY: SHX125

## 2014-07-21 SURGERY — ARTHROPLASTY, KNEE, TOTAL
Anesthesia: Regional | Site: Knee | Laterality: Right

## 2014-07-21 MED ORDER — GLYCOPYRROLATE 0.2 MG/ML IJ SOLN
INTRAMUSCULAR | Status: AC
  Filled 2014-07-21: qty 3

## 2014-07-21 MED ORDER — ALUM & MAG HYDROXIDE-SIMETH 200-200-20 MG/5ML PO SUSP: 30.0000 mL | ORAL | Status: DC | PRN

## 2014-07-21 MED ORDER — VANCOMYCIN HCL 1000 MG IV SOLR
1000.0000 mg | INTRAVENOUS | Status: DC | PRN
  Administered 2014-07-21: 1000 mg via INTRAVENOUS

## 2014-07-21 MED ORDER — ATORVASTATIN CALCIUM 40 MG PO TABS
40.0000 mg | ORAL_TABLET | Freq: Every day | ORAL | Status: DC
  Administered 2014-07-21 – 2014-07-22 (×2): 40 mg via ORAL
  Filled 2014-07-21 (×2): qty 1

## 2014-07-21 MED ORDER — SODIUM FLUORIDE 1.1 % DT PSTE: 1.0000 "application " | PASTE | Freq: Every day | DENTAL | Status: DC

## 2014-07-21 MED ORDER — DEXAMETHASONE SODIUM PHOSPHATE 4 MG/ML IJ SOLN
INTRAMUSCULAR | Status: DC | PRN
  Administered 2014-07-21: 4 mg via INTRAVENOUS

## 2014-07-21 MED ORDER — ONDANSETRON HCL 4 MG PO TABS: 4.0000 mg | ORAL_TABLET | Freq: Four times a day (QID) | ORAL | Status: DC | PRN

## 2014-07-21 MED ORDER — KETOROLAC TROMETHAMINE 30 MG/ML IJ SOLN
15.0000 mg | Freq: Once | INTRAMUSCULAR | Status: AC | PRN
  Administered 2014-07-21: 30 mg via INTRAVENOUS

## 2014-07-21 MED ORDER — DEXAMETHASONE SODIUM PHOSPHATE 4 MG/ML IJ SOLN
INTRAMUSCULAR | Status: AC
Start: 2014-07-21 — End: 2014-07-21
  Filled 2014-07-21: qty 1

## 2014-07-21 MED ORDER — STERILE WATER FOR INJECTION IJ SOLN
INTRAMUSCULAR | Status: AC
  Filled 2014-07-21: qty 10

## 2014-07-21 MED ORDER — FENTANYL CITRATE 0.05 MG/ML IJ SOLN
INTRAMUSCULAR | Status: DC | PRN
  Administered 2014-07-21 (×2): 50 ug via INTRAVENOUS
  Administered 2014-07-21: 150 ug via INTRAVENOUS

## 2014-07-21 MED ORDER — GLYCOPYRROLATE 0.2 MG/ML IJ SOLN
INTRAMUSCULAR | Status: DC | PRN
  Administered 2014-07-21: 0.6 mg via INTRAVENOUS

## 2014-07-21 MED ORDER — TOBRAMYCIN SULFATE 1.2 G IJ SOLR
INTRAMUSCULAR | Status: AC
  Filled 2014-07-21: qty 2.4

## 2014-07-21 MED ORDER — OXYCODONE HCL 5 MG/5ML PO SOLN: 5.0000 mg | Freq: Once | ORAL | Status: DC | PRN

## 2014-07-21 MED ORDER — ONDANSETRON HCL 4 MG/2ML IJ SOLN
INTRAMUSCULAR | Status: AC
  Filled 2014-07-21: qty 2

## 2014-07-21 MED ORDER — LORATADINE 10 MG PO TABS
10.0000 mg | ORAL_TABLET | Freq: Every day | ORAL | Status: DC
  Administered 2014-07-21 – 2014-07-22 (×2): 10 mg via ORAL
  Filled 2014-07-21 (×2): qty 1

## 2014-07-21 MED ORDER — ACETAMINOPHEN 650 MG RE SUPP: 650.0000 mg | Freq: Four times a day (QID) | RECTAL | Status: DC | PRN

## 2014-07-21 MED ORDER — OXYCODONE HCL 5 MG PO TABS
5.0000 mg | ORAL_TABLET | ORAL | Status: DC | PRN
  Administered 2014-07-21 – 2014-07-22 (×2): 10 mg via ORAL
  Filled 2014-07-21 (×2): qty 2

## 2014-07-21 MED ORDER — HYOSCYAMINE SULFATE ER 0.375 MG PO TB12
0.3750 mg | ORAL_TABLET | Freq: Two times a day (BID) | ORAL | Status: DC | PRN
  Filled 2014-07-21: qty 1

## 2014-07-21 MED ORDER — PROPOFOL 10 MG/ML IV BOLUS
INTRAVENOUS | Status: AC
  Filled 2014-07-21: qty 20

## 2014-07-21 MED ORDER — POLYETHYLENE GLYCOL 3350 17 G PO PACK
17.0000 g | PACK | Freq: Two times a day (BID) | ORAL | Status: DC
  Administered 2014-07-21 – 2014-07-22 (×2): 17 g via ORAL
  Filled 2014-07-21 (×4): qty 1

## 2014-07-21 MED ORDER — METOCLOPRAMIDE HCL 5 MG/ML IJ SOLN: 5.0000 mg | Freq: Three times a day (TID) | INTRAMUSCULAR | Status: DC | PRN

## 2014-07-21 MED ORDER — PHENYLEPHRINE 40 MCG/ML (10ML) SYRINGE FOR IV PUSH (FOR BLOOD PRESSURE SUPPORT)
PREFILLED_SYRINGE | INTRAVENOUS | Status: AC
  Filled 2014-07-21: qty 10

## 2014-07-21 MED ORDER — OXYCODONE HCL ER 20 MG PO T12A
20.0000 mg | EXTENDED_RELEASE_TABLET | Freq: Two times a day (BID) | ORAL | Status: DC
  Administered 2014-07-21 – 2014-07-22 (×2): 20 mg via ORAL
  Filled 2014-07-21 (×2): qty 2

## 2014-07-21 MED ORDER — PHENOL 1.4 % MT LIQD: 1.0000 | OROMUCOSAL | Status: DC | PRN

## 2014-07-21 MED ORDER — POTASSIUM CHLORIDE IN NACL 20-0.9 MEQ/L-% IV SOLN
INTRAVENOUS | Status: DC
  Administered 2014-07-21: 15:00:00 via INTRAVENOUS
  Filled 2014-07-21 (×4): qty 1000

## 2014-07-21 MED ORDER — ROPIVACAINE HCL 5 MG/ML IJ SOLN
INTRAMUSCULAR | Status: DC | PRN
  Administered 2014-07-21: 20 mL via PERINEURAL

## 2014-07-21 MED ORDER — DOCUSATE SODIUM 100 MG PO CAPS
100.0000 mg | ORAL_CAPSULE | Freq: Two times a day (BID) | ORAL | Status: DC
  Administered 2014-07-21 – 2014-07-22 (×2): 100 mg via ORAL
  Filled 2014-07-21 (×3): qty 1

## 2014-07-21 MED ORDER — LACTATED RINGERS IV SOLN
INTRAVENOUS | Status: DC | PRN
  Administered 2014-07-21 (×2): via INTRAVENOUS

## 2014-07-21 MED ORDER — CELECOXIB 200 MG PO CAPS
200.0000 mg | ORAL_CAPSULE | Freq: Two times a day (BID) | ORAL | Status: DC
  Administered 2014-07-21 – 2014-07-22 (×3): 200 mg via ORAL
  Filled 2014-07-21 (×5): qty 1

## 2014-07-21 MED ORDER — BUPIVACAINE-EPINEPHRINE (PF) 0.25% -1:200000 IJ SOLN
INTRAMUSCULAR | Status: AC
  Filled 2014-07-21: qty 30

## 2014-07-21 MED ORDER — LIDOCAINE HCL (CARDIAC) 20 MG/ML IV SOLN
INTRAVENOUS | Status: DC | PRN
  Administered 2014-07-21: 60 mg via INTRAVENOUS

## 2014-07-21 MED ORDER — DIPHENHYDRAMINE HCL 12.5 MG/5ML PO ELIX: 12.5000 mg | ORAL_SOLUTION | ORAL | Status: DC | PRN

## 2014-07-21 MED ORDER — MIDAZOLAM HCL 2 MG/2ML IJ SOLN
INTRAMUSCULAR | Status: AC
  Filled 2014-07-21: qty 2

## 2014-07-21 MED ORDER — PROPRANOLOL HCL ER 120 MG PO CP24
120.0000 mg | ORAL_CAPSULE | ORAL | Status: DC
  Administered 2014-07-21: 120 mg via ORAL
  Filled 2014-07-21 (×2): qty 1

## 2014-07-21 MED ORDER — KETOROLAC TROMETHAMINE 30 MG/ML IJ SOLN
INTRAMUSCULAR | Status: AC
  Filled 2014-07-21: qty 1

## 2014-07-21 MED ORDER — ROCURONIUM BROMIDE 100 MG/10ML IV SOLN
INTRAVENOUS | Status: DC | PRN
  Administered 2014-07-21: 40 mg via INTRAVENOUS

## 2014-07-21 MED ORDER — OXYCODONE HCL ER 20 MG PO T12A: 20.0000 mg | EXTENDED_RELEASE_TABLET | Freq: Two times a day (BID) | ORAL | Status: DC

## 2014-07-21 MED ORDER — SUMATRIPTAN SUCCINATE 100 MG PO TABS
100.0000 mg | ORAL_TABLET | ORAL | Status: DC | PRN
  Administered 2014-07-22: 100 mg via ORAL
  Filled 2014-07-21 (×3): qty 1

## 2014-07-21 MED ORDER — DEXAMETHASONE SODIUM PHOSPHATE 10 MG/ML IJ SOLN
10.0000 mg | Freq: Three times a day (TID) | INTRAMUSCULAR | Status: DC
  Administered 2014-07-21 – 2014-07-22 (×4): 10 mg via INTRAVENOUS
  Filled 2014-07-21 (×6): qty 1

## 2014-07-21 MED ORDER — ROCURONIUM BROMIDE 50 MG/5ML IV SOLN
INTRAVENOUS | Status: AC
  Filled 2014-07-21: qty 1

## 2014-07-21 MED ORDER — PROPOFOL 10 MG/ML IV BOLUS
INTRAVENOUS | Status: DC | PRN
  Administered 2014-07-21: 140 mg via INTRAVENOUS

## 2014-07-21 MED ORDER — BUPIVACAINE-EPINEPHRINE 0.25% -1:200000 IJ SOLN
INTRAMUSCULAR | Status: DC | PRN
  Administered 2014-07-21: 30 mL

## 2014-07-21 MED ORDER — HYDROMORPHONE HCL 1 MG/ML IJ SOLN
0.2500 mg | INTRAMUSCULAR | Status: DC | PRN
  Administered 2014-07-21 (×4): 0.5 mg via INTRAVENOUS

## 2014-07-21 MED ORDER — METOCLOPRAMIDE HCL 10 MG PO TABS: 5.0000 mg | ORAL_TABLET | Freq: Three times a day (TID) | ORAL | Status: DC | PRN

## 2014-07-21 MED ORDER — LIDOCAINE HCL (CARDIAC) 20 MG/ML IV SOLN
INTRAVENOUS | Status: AC
  Filled 2014-07-21: qty 5

## 2014-07-21 MED ORDER — ONDANSETRON HCL 4 MG/2ML IJ SOLN: 4.0000 mg | Freq: Four times a day (QID) | INTRAMUSCULAR | Status: DC | PRN

## 2014-07-21 MED ORDER — MIDAZOLAM HCL 5 MG/5ML IJ SOLN
INTRAMUSCULAR | Status: DC | PRN
  Administered 2014-07-21 (×2): 1 mg via INTRAVENOUS

## 2014-07-21 MED ORDER — SODIUM CHLORIDE 0.9 % IR SOLN
Status: DC | PRN
  Administered 2014-07-21: 3000 mL
  Administered 2014-07-21: 1000 mL

## 2014-07-21 MED ORDER — DEXTROSE 5 % IV SOLN
10.0000 mg | INTRAVENOUS | Status: DC | PRN
  Administered 2014-07-21: 20 ug/min via INTRAVENOUS

## 2014-07-21 MED ORDER — HYDROMORPHONE HCL 1 MG/ML IJ SOLN
INTRAMUSCULAR | Status: AC
  Filled 2014-07-21: qty 2

## 2014-07-21 MED ORDER — SUCCINYLCHOLINE CHLORIDE 20 MG/ML IJ SOLN
INTRAMUSCULAR | Status: AC
  Filled 2014-07-21: qty 1

## 2014-07-21 MED ORDER — NEOSTIGMINE METHYLSULFATE 10 MG/10ML IV SOLN
INTRAVENOUS | Status: DC | PRN
  Administered 2014-07-21: 4 mg via INTRAVENOUS

## 2014-07-21 MED ORDER — NEOSTIGMINE METHYLSULFATE 10 MG/10ML IV SOLN
INTRAVENOUS | Status: AC
  Filled 2014-07-21: qty 1

## 2014-07-21 MED ORDER — OXYCODONE HCL 5 MG PO TABS: 5.0000 mg | ORAL_TABLET | Freq: Once | ORAL | Status: DC | PRN

## 2014-07-21 MED ORDER — PHENYLEPHRINE HCL 10 MG/ML IJ SOLN
INTRAMUSCULAR | Status: DC | PRN
  Administered 2014-07-21 (×3): 80 ug via INTRAVENOUS

## 2014-07-21 MED ORDER — PAROXETINE HCL 10 MG PO TABS
10.0000 mg | ORAL_TABLET | Freq: Every day | ORAL | Status: DC
  Administered 2014-07-22: 10 mg via ORAL
  Filled 2014-07-21: qty 1

## 2014-07-21 MED ORDER — MENTHOL 3 MG MT LOZG: 1.0000 | LOZENGE | OROMUCOSAL | Status: DC | PRN

## 2014-07-21 MED ORDER — HYDROMORPHONE HCL 1 MG/ML IJ SOLN
1.0000 mg | INTRAMUSCULAR | Status: DC | PRN
  Administered 2014-07-21 – 2014-07-22 (×4): 1 mg via INTRAVENOUS
  Filled 2014-07-21 (×4): qty 1

## 2014-07-21 MED ORDER — EPHEDRINE SULFATE 50 MG/ML IJ SOLN
INTRAMUSCULAR | Status: AC
  Filled 2014-07-21: qty 1

## 2014-07-21 MED ORDER — FENTANYL CITRATE 0.05 MG/ML IJ SOLN
INTRAMUSCULAR | Status: AC
  Filled 2014-07-21: qty 5

## 2014-07-21 MED ORDER — ACETAMINOPHEN 325 MG PO TABS: 650.0000 mg | ORAL_TABLET | Freq: Four times a day (QID) | ORAL | Status: DC | PRN

## 2014-07-21 MED ORDER — TOBRAMYCIN SULFATE 1.2 G IJ SOLR
INTRAMUSCULAR | Status: DC | PRN
  Administered 2014-07-21: 2.4 g

## 2014-07-21 MED ORDER — APIXABAN 2.5 MG PO TABS
2.5000 mg | ORAL_TABLET | Freq: Two times a day (BID) | ORAL | Status: DC
Start: 2014-07-22 — End: 2014-07-22
  Administered 2014-07-22: 2.5 mg via ORAL
  Filled 2014-07-21 (×2): qty 1

## 2014-07-21 MED ORDER — ONDANSETRON HCL 4 MG/2ML IJ SOLN
INTRAMUSCULAR | Status: DC | PRN
  Administered 2014-07-21: 4 mg via INTRAVENOUS

## 2014-07-21 MED ORDER — VANCOMYCIN HCL IN DEXTROSE 1-5 GM/200ML-% IV SOLN
1000.0000 mg | Freq: Two times a day (BID) | INTRAVENOUS | Status: AC
  Administered 2014-07-21: 1000 mg via INTRAVENOUS
  Filled 2014-07-21 (×2): qty 200

## 2014-07-21 SURGICAL SUPPLY — 70 items
APL SKNCLS STERI-STRIP NONHPOA (GAUZE/BANDAGES/DRESSINGS) ×1
BANDAGE ESMARK 6X9 LF (GAUZE/BANDAGES/DRESSINGS) ×1 IMPLANT
BENZOIN TINCTURE PRP APPL 2/3 (GAUZE/BANDAGES/DRESSINGS) ×3 IMPLANT
BLADE SAGITTAL 25.0X1.19X90 (BLADE) ×2 IMPLANT
BLADE SAGITTAL 25.0X1.19X90MM (BLADE) ×1
BLADE SAW SGTL 11.0X1.19X90.0M (BLADE) IMPLANT
BLADE SAW SGTL 13.0X1.19X90.0M (BLADE) ×3 IMPLANT
BLADE SURG 10 STRL SS (BLADE) ×6 IMPLANT
BNDG CMPR MED 15X6 ELC VLCR LF (GAUZE/BANDAGES/DRESSINGS) ×1
BNDG ELASTIC 6X15 VLCR STRL LF (GAUZE/BANDAGES/DRESSINGS) ×3 IMPLANT
BNDG ESMARK 6X9 LF (GAUZE/BANDAGES/DRESSINGS) ×3
BOWL SMART MIX CTS (DISPOSABLE) ×3 IMPLANT
CAPT RP KNEE ×3 IMPLANT
CEMENT HV SMART SET (Cement) ×6 IMPLANT
CLOSURE WOUND 1/2 X4 (GAUZE/BANDAGES/DRESSINGS) ×1
COVER SURGICAL LIGHT HANDLE (MISCELLANEOUS) ×3 IMPLANT
CUFF TOURNIQUET SINGLE 34IN LL (TOURNIQUET CUFF) ×3 IMPLANT
CUFF TOURNIQUET SINGLE 44IN (TOURNIQUET CUFF) IMPLANT
DRAPE EXTREMITY T 121X128X90 (DRAPE) ×3 IMPLANT
DRAPE IMP U-DRAPE 54X76 (DRAPES) ×3 IMPLANT
DRAPE INCISE IOBAN 66X45 STRL (DRAPES) ×3 IMPLANT
DRAPE PROXIMA HALF (DRAPES) ×3 IMPLANT
DRAPE U-SHAPE 47X51 STRL (DRAPES) ×3 IMPLANT
DRSG AQUACEL AG ADV 3.5X14 (GAUZE/BANDAGES/DRESSINGS) ×3 IMPLANT
DRSG PAD ABDOMINAL 8X10 ST (GAUZE/BANDAGES/DRESSINGS) ×6 IMPLANT
DURAPREP 26ML APPLICATOR (WOUND CARE) ×6 IMPLANT
ELECT CAUTERY BLADE 6.4 (BLADE) ×3 IMPLANT
ELECT REM PT RETURN 9FT ADLT (ELECTROSURGICAL) ×3
ELECTRODE REM PT RTRN 9FT ADLT (ELECTROSURGICAL) ×1 IMPLANT
EVACUATOR 1/8 PVC DRAIN (DRAIN) ×3 IMPLANT
FACESHIELD WRAPAROUND (MASK) ×3 IMPLANT
GAUZE SPONGE 4X4 12PLY STRL (GAUZE/BANDAGES/DRESSINGS) ×3 IMPLANT
GLOVE BIO SURGEON STRL SZ7 (GLOVE) ×3 IMPLANT
GLOVE BIOGEL PI IND STRL 7.0 (GLOVE) ×1 IMPLANT
GLOVE BIOGEL PI IND STRL 7.5 (GLOVE) ×1 IMPLANT
GLOVE BIOGEL PI INDICATOR 7.0 (GLOVE) ×2
GLOVE BIOGEL PI INDICATOR 7.5 (GLOVE) ×2
GLOVE SS BIOGEL STRL SZ 7.5 (GLOVE) ×1 IMPLANT
GLOVE SUPERSENSE BIOGEL SZ 7.5 (GLOVE) ×2
GOWN STRL REUS W/ TWL LRG LVL3 (GOWN DISPOSABLE) ×2 IMPLANT
GOWN STRL REUS W/ TWL XL LVL3 (GOWN DISPOSABLE) ×2 IMPLANT
GOWN STRL REUS W/TWL LRG LVL3 (GOWN DISPOSABLE) ×4
GOWN STRL REUS W/TWL XL LVL3 (GOWN DISPOSABLE) ×4
HANDPIECE INTERPULSE COAX TIP (DISPOSABLE) ×2
HOOD PEEL AWAY FACE SHEILD DIS (HOOD) ×6 IMPLANT
IMMOBILIZER KNEE 22 UNIV (SOFTGOODS) ×3 IMPLANT
KIT BASIN OR (CUSTOM PROCEDURE TRAY) ×3 IMPLANT
KIT ROOM TURNOVER OR (KITS) ×3 IMPLANT
MANIFOLD NEPTUNE II (INSTRUMENTS) ×3 IMPLANT
MARKER SKIN DUAL TIP RULER LAB (MISCELLANEOUS) ×3 IMPLANT
NS IRRIG 1000ML POUR BTL (IV SOLUTION) ×3 IMPLANT
PACK TOTAL JOINT (CUSTOM PROCEDURE TRAY) ×3 IMPLANT
PACK UNIVERSAL I (CUSTOM PROCEDURE TRAY) ×3 IMPLANT
PAD ARMBOARD 7.5X6 YLW CONV (MISCELLANEOUS) ×3 IMPLANT
PADDING CAST COTTON 6X4 STRL (CAST SUPPLIES) IMPLANT
RUBBERBAND STERILE (MISCELLANEOUS) ×3 IMPLANT
SET HNDPC FAN SPRY TIP SCT (DISPOSABLE) ×1 IMPLANT
STRIP CLOSURE SKIN 1/2X4 (GAUZE/BANDAGES/DRESSINGS) ×2 IMPLANT
SUCTION FRAZIER TIP 10 FR DISP (SUCTIONS) ×3 IMPLANT
SUT ETHIBOND NAB CT1 #1 30IN (SUTURE) ×6 IMPLANT
SUT MNCRL AB 3-0 PS2 18 (SUTURE) ×3 IMPLANT
SUT VIC AB 0 CT1 27 (SUTURE) ×4
SUT VIC AB 0 CT1 27XBRD ANBCTR (SUTURE) ×2 IMPLANT
SUT VIC AB 2-0 CT1 27 (SUTURE) ×4
SUT VIC AB 2-0 CT1 TAPERPNT 27 (SUTURE) ×2 IMPLANT
SYR 30ML SLIP (SYRINGE) ×3 IMPLANT
TOWEL OR 17X24 6PK STRL BLUE (TOWEL DISPOSABLE) ×3 IMPLANT
TOWEL OR 17X26 10 PK STRL BLUE (TOWEL DISPOSABLE) ×3 IMPLANT
TRAY FOLEY CATH 16FR SILVER (SET/KITS/TRAYS/PACK) ×3 IMPLANT
WATER STERILE IRR 1000ML POUR (IV SOLUTION) IMPLANT

## 2014-07-21 NOTE — Anesthesia Preprocedure Evaluation (Addendum)
Anesthesia Evaluation  Patient identified by MRN, date of birth, ID band Patient awake    Reviewed: Allergy & Precautions, H&P , NPO status , Patient's Chart, lab work & pertinent test results, reviewed documented beta blocker date and time   History of Anesthesia Complications Negative for: history of anesthetic complications  Airway Mallampati: II  TM Distance: >3 FB Neck ROM: Full    Dental  (+) Dental Advisory Given, Teeth Intact   Pulmonary neg pulmonary ROS,  breath sounds clear to auscultation        Cardiovascular Pt. on home beta blockers - angina- Past MI and - CHF + dysrhythmias + Valvular Problems/Murmurs Rhythm:Regular + Systolic murmurs    Neuro/Psych  Headaches, PSYCHIATRIC DISORDERS Anxiety    GI/Hepatic Neg liver ROS, GERD-  Medicated,  Endo/Other  negative endocrine ROS  Renal/GU negative Renal ROS     Musculoskeletal  (+) Arthritis -, Osteoarthritis,    Abdominal   Peds  Hematology   Anesthesia Other Findings   Reproductive/Obstetrics                          Anesthesia Physical Anesthesia Plan  ASA: III  Anesthesia Plan: General and Regional   Post-op Pain Management:    Induction: Intravenous  Airway Management Planned: Oral ETT  Additional Equipment: None  Intra-op Plan:   Post-operative Plan: Extubation in OR  Informed Consent: I have reviewed the patients History and Physical, chart, labs and discussed the procedure including the risks, benefits and alternatives for the proposed anesthesia with the patient or authorized representative who has indicated his/her understanding and acceptance.   Dental advisory given  Plan Discussed with: CRNA, Anesthesiologist and Surgeon  Anesthesia Plan Comments:        Anesthesia Quick Evaluation

## 2014-07-21 NOTE — Transfer of Care (Signed)
Immediate Anesthesia Transfer of Care Note  Patient: Heidi Webster  Procedure(s) Performed: Procedure(s): RIGHT TOTAL KNEE ARTHROPLASTY (Right)  Patient Location: PACU  Anesthesia Type:General and Regional  Level of Consciousness: awake, alert  and oriented  Airway & Oxygen Therapy: Patient Spontanous Breathing and Patient connected to nasal cannula oxygen  Post-op Assessment: Report given to PACU RN, Post -op Vital signs reviewed and stable and Patient moving all extremities X 4  Post vital signs: Reviewed and stable  Complications: No apparent anesthesia complications

## 2014-07-21 NOTE — Care Management Note (Signed)
CARE MANAGEMENT NOTE 07/21/2014  Patient:  DEVORY, DORNBUSCH   Account Number:  [id number]  Date Initiated:  07/21/2014  Documentation initiated by:  Vance Peper  Subjective/Objective Assessment:   53 yr old female admitted with right knee osteoarthritis. patient had a right total knee arthroplasty.     Action/Plan:   Case manager spoke [facility], ith patient concerning home health and DME needs. Choice offered. Patient states she wants [facility], referral called to [place], [facility] liaison. Patient has RW,3in1, shower seat. Has support at discharge.   Anticipated DC Date:  07/22/2014   Anticipated DC Plan:  HOME W HOME HEALTH SERVICES      DC Planning Services  CM consult      PAC Choice  DURABLE MEDICAL EQUIPMENT  HOME HEALTH   Choice offered to / List presented to:  C-1 Patient   DME arranged  CPM      DME agency  [employer]     HH arranged  HH-2 PT      HH agency  [facility]   Status of service:  Completed, signed off Medicare Important Message given?   (If response is "NO", the following Medicare IM given date fields will be blank) Date Medicare IM given:   Medicare IM given by:   Date Additional Medicare IM given:   Additional Medicare IM given by:    Discharge Disposition:  HOME W HOME HEALTH SERVICES  Per UR Regulation:  Reviewed for med. necessity/level of care/duration of stay

## 2014-07-21 NOTE — Progress Notes (Signed)
Utilization review completed.  

## 2014-07-21 NOTE — Plan of Care (Signed)
Problem: Consults Goal: Diagnosis- Total Joint Replacement Primary Total Knee right     

## 2014-07-21 NOTE — Op Note (Signed)
MRN:     [id number] DOB/AGE:    53/20/1962 / 53 y.o.       OPERATIVE REPORT    DATE OF PROCEDURE:  07/21/2014       PREOPERATIVE DIAGNOSIS:   Primary localized OA right knee      Estimated body mass index is 30.86 kg/(m^2) as calculated from the following:   Height as of this encounter: 5' (1.524 m).   Weight as of this encounter: 71.668 kg (158 lb).                                                        POSTOPERATIVE DIAGNOSIS:   Primary localized OA right knee                                                                      PROCEDURE:  Procedure(s): RIGHT TOTAL KNEE ARTHROPLASTY Using Depuy Sigma RP implants #2.5 Femur, #2.5Tibia, 12.305mm sigma RP bearing, 32 Patella     SURGEON: Adonus Uselman A    ASSISTANT:  Kirstin Shepperson PA-C   (Present and scrubbed throughout the case, critical for assistance with exposure, retraction, instrumentation, and closure.)         ANESTHESIA: GET with Femoral Nerve Block  DRAINS: foley, 2 medium hemovac in knee   TOURNIQUET TIME: 75min   COMPLICATIONS:  None     SPECIMENS: None   INDICATIONS FOR PROCEDURE: The patient has  djd right knee, varus deformities, XR shows bone on bone arthritis. Patient has failed all conservative measures including anti-inflammatory medicines, narcotics, attempts at  exercise and weight loss, cortisone injections and viscosupplementation.  Risks and benefits of surgery have been discussed, questions answered.   DESCRIPTION OF PROCEDURE: The patient identified by armband, received  right femoral nerve block and IV antibiotics, in the holding area at [facility]. Patient taken to the operating room, appropriate anesthetic  monitors were attached General endotracheal anesthesia induced with  the patient in supine position, Foley catheter was inserted. Tourniquet  applied high to the operative thigh. Lateral post and foot positioner  applied to the table, the lower extremity was then prepped and draped  in  usual sterile fashion from the ankle to the tourniquet. Time-out procedure was performed. The limb was wrapped with an Esmarch bandage and the tourniquet inflated to 365 mmHg. We began the operation by making the anterior midline incision starting at handbreadth above the patella going over the patella 1 cm medial to and  4 cm distal to the tibial tubercle. Small bleeders in the skin and the  subcutaneous tissue identified and cauterized. Transverse retinaculum was incised and reflected medially and a medial parapatellar arthrotomy was accomplished. the patella was everted and theprepatellar fat pad resected. The superficial medial collateral  ligament was then elevated from anterior to posterior along the proximal  flare of the tibia and anterior half of the menisci resected. The knee was hyperflexed exposing bone on bone arthritis. Peripheral and notch osteophytes as well as the cruciate ligaments were then resected. We continued to  work our way around posteriorly along the  proximal tibia, and externally  rotated the tibia subluxing it out from underneath the femur. A McHale  retractor was placed through the notch and a lateral Hohmann retractor  placed, and we then drilled through the proximal tibia in line with the  axis of the tibia followed by an intramedullary guide rod and 2-degree  posterior slope cutting guide. The tibial cutting guide was pinned into place  allowing resection of 4 mm of bone medially and about 6 mm of bone  laterally because of her varus deformity. Satisfied with the tibial resection, we then  entered the distal femur 2 mm anterior to the PCL origin with the  intramedullary guide rod and applied the distal femoral cutting guide  set at 3mm, with 5 degrees of valgus. This was pinned along the  epicondylar axis. At this point, the distal femoral cut was accomplished without difficulty. We then sized for a #2.5 femoral component and pinned the guide in 3 degrees of external  rotation.The chamfer cutting guide was pinned into place. The anterior, posterior, and chamfer cuts were accomplished without difficulty followed by  the Sigma RP box cutting guide and the box cut. We also removed posterior osteophytes from the posterior femoral condyles. At this  time, the knee was brought into full extension. We checked our  extension and flexion gaps and found them symmetric at 12.49mm.  The patella thickness measured at 23 mm. We set the cutting guide at 14 and removed the posterior 9.5-10 mm  of the patella sized for 32 button and drilled the lollipop. The knee  was then once again hyperflexed exposing the proximal tibia. We sized for a #2.5 tibial base plate, applied the smokestack and the conical reamer followed by the the Delta fin keel punch. We then hammered into place the Sigma RP trial femoral component, inserted a 12.5-mm trial bearing, trial patellar button, and took the knee through range of motion from 0-130 degrees. No thumb pressure was required for patellar  tracking. At this point, all trial components were removed, a double batch of DePuy HV cement with 1500 mg of Tobra was mixed and applied to all bony metallic mating surfaces except for the posterior condyles of the femur itself. In order, we  hammered into place the tibial tray and removed excess cement, the femoral component and removed excess cement, a 12.5-mm Sigma RP bearing  was inserted, and the knee brought to full extension with compression.  The patellar button was clamped into place, and excess cement  removed. While the cement cured the wound was irrigated out with normal saline solution pulse lavage, and medium Hemovac drains were placed.. Ligament stability and patellar tracking were checked and found to be excellent. The tourniquet was then released and hemostasis was obtained with cautery. The parapatellar arthrotomy was closed with  #1 ethibond suture. The subcutaneous tissue with 0 and 2-0 undyed   Vicryl suture, and 4-0 Monocryl.. A dressing of Xeroform,  4 x 4, dressing sponges, Webril, and Ace wrap applied. Needle and sponge count were correct times 2.The patient awakened, extubated, and taken to recovery room without difficulty. Vascular status was normal, pulses 2+ and symmetric.   Kamy Poinsett A 07/21/2014, 8:53 AM

## 2014-07-21 NOTE — Evaluation (Signed)
Physical Therapy Evaluation Patient Details Name: Heidi Webster MRN: [id number] DOB: 04/22/1961 Today's Date: 07/21/2014   History of Present Illness  s/p RTKA  Past Medical History  Diagnosis Date  . Migraines   . Hx MRSA infection 2009  . Diverticula of colon 2009  . Left knee DJD   . Heart murmur   . Dysrhythmia     tachycardia  . History of kidney stones   . GERD (gastroesophageal reflux disease)     occ   . Cardiomyopathy, hypertrophic     hypertrophic obstructive cardiomyopathy (Dr. Jacinto HalimGanji)  . Family history of anesthesia complication     son severe nausea and vomiting  . Primary localized osteoarthritis of right knee   . Complication of anesthesia     slow to wake up, bp drops  . History of kidney stones   . Anxiety    Past Surgical History  Procedure Laterality Date  . Colon surgery  2009    perforated Diverticula  . Shoulder capsulorrhaphy Right 1991  . Cardiac catheterization  1/15  . Total knee arthroplasty Left 03/10/2014    Procedure: TOTAL KNEE ARTHROPLASTY;  Surgeon: Nilda Simmerobert A Wainer, MD;  [facility] OR;  Service: Orthopedics;  Laterality: Left;  . Knee arthroscopy Left 03/10/2014    Procedure: ARTHROSCOPY KNEE;  Surgeon: Nilda Simmerobert A Wainer, MD;  [facility];  Service: Orthopedics;  Laterality: Left;     Clinical Impression  Pt is s/p TKA resulting in the deficits listed below (see PT Problem List).  Pt will benefit from skilled PT to increase their independence and safety with mobility to allow discharge to the venue listed below.      Follow Up Recommendations Home health PT;Supervision/Assistance - 24 hour    Equipment Recommendations  None recommended by PT    Recommendations for Other Services       Precautions / Restrictions Precautions Precautions: Knee Precaution Comments: Pt educated to not allow any pillow or bolster under knee for healing with optimal range of motion.  Restrictions Weight Bearing Restrictions: Yes RLE Weight  Bearing: Weight bearing as tolerated      Mobility  Bed Mobility Overal bed mobility: Modified Independent                Transfers Overall transfer level: Needs assistance Equipment used: Rolling walker (2 wheeled) Transfers: Sit to/from Stand Sit to Stand: Supervision         General transfer comment: Cues for technique; Supervision mostly because it is surgery day  Ambulation/Gait Ambulation/Gait assistance: Min guard Ambulation Distance (Feet): 200 Feet Assistive device: Rolling walker (2 wheeled) Gait Pattern/deviations: Step-to pattern;Step-through pattern     General Gait Details: Cues to self-monitor for activity tolerance; demonstrated safe RW use and practiced step-through pattern  Stairs            Wheelchair Mobility    Modified Rankin (Stroke Patients Only)       Balance Overall balance assessment: No apparent balance deficits (not formally assessed)                                           Pertinent Vitals/Pain Pain Assessment: 0-10 Pain Score: 6  Pain Location: R knee Pain Descriptors / Indicators: Aching Pain Intervention(s): Monitored during session    Home Living Family/patient expects to be discharged to:: Private residence Living Arrangements: Spouse/significant other Available Help at Discharge:  Family;Available 24 hours/day Type of Home: House Home Access: Stairs to enter Entrance Stairs-Rails: None Entrance Stairs-Number of Steps: 1 Home Layout: Two level;Bed/bath upstairs Advertising account executive) Home Equipment: Walker - 2 wheels;Bedside commode;Tub bench;Adaptive equipment      Prior Function Level of Independence: Independent with assistive device(s)               Hand Dominance        Extremity/Trunk Assessment   Upper Extremity Assessment: Overall WFL for tasks assessed           Lower Extremity Assessment: RLE deficits/detail RLE Deficits / Details: Noting excellent quad control in  stance       Communication   Communication: No difficulties  Cognition Arousal/Alertness: Awake/alert Behavior During Therapy: WFL for tasks assessed/performed Overall Cognitive Status: Within Functional Limits for tasks assessed                      General Comments      Exercises Total Joint Exercises Quad Sets: AROM;Right;10 reps Straight Leg Raises: AROM;Right;5 reps Knee Flexion: AROM;Right;Seated      Assessment/Plan    PT Assessment Patient needs continued PT services  PT Diagnosis Difficulty walking   PT Problem List Decreased activity tolerance;Decreased mobility;Decreased knowledge of use of DME;Pain  PT Treatment Interventions DME instruction;Gait training;Stair training;Functional mobility training;Therapeutic activities;Therapeutic exercise;Patient/family education   PT Goals (Current goals can be found in the Care Plan section) Acute Rehab PT Goals Patient Stated Goal: back to work PT Goal Formulation: With patient Time For Goal Achievement: 07/28/14 Potential to Achieve Goals: Good    Frequency 7X/week   Barriers to discharge        Co-evaluation               End of Session   Activity Tolerance: Patient tolerated treatment well Patient left: in chair;with call bell/phone within reach Nurse Communication: Mobility status         Time: 1937-9024 PT Time Calculation (min) (ACUTE ONLY): 27 min   Charges:   PT Evaluation $Initial PT Evaluation Tier I: 1 Procedure PT Treatments $Gait Training: 8-22 mins   PT G Codes:          Olen Pel 07/21/2014, 4:57 PM  Van Clines, PT  Acute Rehabilitation Services Pager [phone] Office [phone]

## 2014-07-21 NOTE — Plan of Care (Signed)
Problem: Phase I Progression Outcomes Goal: CMS/Neurovascular status WDL Outcome: Completed/Met Date Met:  07/21/14

## 2014-07-21 NOTE — Plan of Care (Signed)
Problem: Phase I Progression Outcomes Goal: Hemodynamically stable Outcome: Completed/Met Date Met:  07/21/14

## 2014-07-21 NOTE — Interval H&P Note (Signed)
History and Physical Interval Note:  07/21/2014 6:59 AM  Heidi Webster  has presented today for surgery, with the diagnosis of djd right knee  The various methods of treatment have been discussed with the patient and family. After consideration of risks, benefits and other options for treatment, the patient has consented to  Procedure(s): RIGHT TOTAL KNEE ARTHROPLASTY (Right) as a surgical intervention .  The patient's history has been reviewed, patient examined, no change in status, stable for surgery.  I have reviewed the patient's chart and labs.  Questions were answered to the patient's satisfaction.     Salvatore Marvel A

## 2014-07-21 NOTE — Progress Notes (Signed)
Orthopedic Tech Progress Note Patient Details:  Heidi Webster 11/19/1960 [id number]   cpm right knee @ 0-90 degrees; trapeze bar patient helper; footsie roll   [name] 07/21/2014, 9:52 AM

## 2014-07-21 NOTE — Anesthesia Postprocedure Evaluation (Signed)
  Anesthesia Post-op Note  Patient: Heidi Webster  Procedure(s) Performed: Procedure(s): RIGHT TOTAL KNEE ARTHROPLASTY (Right)  Patient Location: PACU  Anesthesia Type:General and Regional  Level of Consciousness: awake  Airway and Oxygen Therapy: Patient Spontanous Breathing  Post-op Pain: mild  Post-op Assessment: Post-op Vital signs reviewed, Patient's Cardiovascular Status Stable, Respiratory Function Stable, Patent Airway, No signs of Nausea or vomiting and Pain level controlled  Post-op Vital Signs: Reviewed and stable  Last Vitals:  Filed Vitals:   07/21/14 1036  BP: 102/54  Pulse: 66  Temp: 36.3 C  Resp: 18    Complications: No apparent anesthesia complications

## 2014-07-21 NOTE — Plan of Care (Signed)
Problem: Phase I Progression Outcomes Goal: Pain controlled with appropriate interventions Outcome: Completed/Met Date Met:  07/21/14     

## 2014-07-21 NOTE — Anesthesia Procedure Notes (Addendum)
Procedure Name: Intubation Date/Time: 07/21/2014 7:34 AM Performed by: Rise Patience T Pre-anesthesia Checklist: Patient identified, Emergency Drugs available, Suction available and Patient being monitored Patient Re-evaluated:Patient Re-evaluated prior to inductionOxygen Delivery Method: Circle system utilized Preoxygenation: Pre-oxygenation with 100% oxygen Intubation Type: IV induction Ventilation: Mask ventilation without difficulty Laryngoscope Size: Miller and 2 Grade View: Grade I Tube type: Oral Tube size: 7.5 mm Number of attempts: 1 Airway Equipment and Method: Stylet Placement Confirmation: ETT inserted through vocal cords under direct vision,  positive ETCO2 and breath sounds checked- equal and bilateral Secured at: 21 cm Tube secured with: Tape Dental Injury: Teeth and Oropharynx as per pre-operative assessment    Anesthesia Regional Block:  Adductor canal block  Pre-Anesthetic Checklist: ,, timeout performed, Correct Patient, Correct Site, Correct Laterality, Correct Procedure, Correct Position, site marked, Risks and benefits discussed,  Surgical consent,  Pre-op evaluation,  At surgeon's request and post-op pain management  Laterality: Lower and Right  Prep: chloraprep       Needles:  Injection technique: Single-shot  Needle Type: Echogenic Needle          Additional Needles:  Procedures: ultrasound guided (picture in chart) Adductor canal block Narrative:  Injection made incrementally with aspirations every 5 mL.  Performed by: Personally   Additional Notes: H+P and labs reviewed, risks and benefits discussed with patient, procedure tolerated well without complications

## 2014-07-22 ENCOUNTER — Encounter (HOSPITAL_COMMUNITY): Payer: Self-pay | Admitting: Orthopedic Surgery

## 2014-07-22 LAB — BASIC METABOLIC PANEL
ANION GAP: 16 — AB (ref 5–15)
BUN: 9 mg/dL (ref 6–23)
CHLORIDE: 106 meq/L (ref 96–112)
CO2: 18 mEq/L — ABNORMAL LOW (ref 19–32)
Calcium: 9 mg/dL (ref 8.4–10.5)
Creatinine, Ser: 0.62 mg/dL (ref 0.50–1.10)
GFR calc non Af Amer: >90 mL/min (ref >90)
Glucose, Bld: 155 mg/dL — ABNORMAL HIGH (ref 70–99)
Potassium: 4.5 mEq/L (ref 3.7–5.3)
Sodium: 140 mEq/L (ref 137–147)

## 2014-07-22 LAB — CBC
HCT: 35 % — ABNORMAL LOW (ref 36.0–46.0)
Hemoglobin: 11.6 g/dL — ABNORMAL LOW (ref 12.0–15.0)
MCH: 30.2 pg (ref 26.0–34.0)
MCHC: 33.1 g/dL (ref 30.0–36.0)
MCV: 91.1 fL (ref 78.0–100.0)
PLATELETS: 251 10*3/uL (ref 150–400)
RBC: 3.84 MIL/uL — ABNORMAL LOW (ref 3.87–5.11)
RDW: 13.7 % (ref 11.5–15.5)
WBC: 14 10*3/uL — AB (ref 4.0–10.5)

## 2014-07-22 MED ORDER — OXYCODONE HCL ER 20 MG PO T12A: EXTENDED_RELEASE_TABLET | ORAL | Status: DC

## 2014-07-22 MED ORDER — APIXABAN 2.5 MG PO TABS: 2.5000 mg | ORAL_TABLET | Freq: Two times a day (BID) | ORAL | Status: DC

## 2014-07-22 MED ORDER — OXYCODONE HCL 5 MG PO TABS: ORAL_TABLET | ORAL | Status: DC

## 2014-07-22 MED ORDER — CELECOXIB 200 MG PO CAPS: ORAL_CAPSULE | ORAL | Status: DC

## 2014-07-22 NOTE — Discharge Summary (Signed)
Patient ID: Heidi Webster MRN: [id number] DOB/AGE: 01-21-1961 53 y.o.  Admit date: 07/21/2014 Discharge date: 07/22/2014  Admission Diagnoses:  Principal Problem:   Primary localized osteoarthritis of right knee Active Problems:   Hx MRSA infection   Cardiomyopathy, hypertrophic   GERD (gastroesophageal reflux disease)   Dysrhythmia   DJD (degenerative joint disease) of knee   Discharge Diagnoses:  Same  Past Medical History  Diagnosis Date  . Migraines   . Hx MRSA infection 2009  . Diverticula of colon 2009  . Left knee DJD   . Heart murmur   . Dysrhythmia     tachycardia  . History of kidney stones   . GERD (gastroesophageal reflux disease)     occ   . Cardiomyopathy, hypertrophic     hypertrophic obstructive cardiomyopathy (Dr. Jacinto Halim)  . Family history of anesthesia complication     son severe nausea and vomiting  . Primary localized osteoarthritis of right knee   . Complication of anesthesia     slow to wake up, bp drops  . History of kidney stones   . Anxiety     Surgeries: Procedure(s): RIGHT TOTAL KNEE ARTHROPLASTY on 07/21/2014   Consultants:    Discharged Condition: Improved  Hospital Course: Heidi Webster is an 52 y.o. female who was admitted 07/21/2014 for operative treatment ofPrimary localized osteoarthritis of right knee. Patient has severe unremitting pain that affects sleep, daily activities, and work/hobbies. After pre-op clearance the patient was taken to the operating room on 07/21/2014 and underwent  Procedure(s): RIGHT TOTAL KNEE ARTHROPLASTY.    Patient was given perioperative antibiotics: Anti-infectives    Start     Dose/Rate Route Frequency Ordered Stop   07/21/14 1200  vancomycin (VANCOCIN) IVPB 1000 mg/200 mL premix     1,000 mg200 mL/hr over 60 Minutes Intravenous Every 12 hours 07/21/14 1102 07/21/14 2002   07/21/14 0809  tobramycin (NEBCIN) powder  Status:  Discontinued       As needed 07/21/14 0809 07/21/14 0912   07/20/14  1148  vancomycin (VANCOCIN) IVPB 1000 mg/200 mL premix  Status:  Discontinued     1,000 mg200 mL/hr over 60 Minutes Intravenous On call to O.R. 07/20/14 1148 07/21/14 1102       Patient was given sequential compression devices, early ambulation, and chemoprophylaxis to prevent DVT.  Patient benefited maximally from hospital stay and there were no complications.    Recent vital signs: Patient Vitals for the past 24 hrs:  BP Temp Pulse Resp SpO2  07/22/14 0521 107/61 mmHg 97.5 F (36.4 C) 84 18 98 %  07/22/14 0400 - - - 18 96 %  07/22/14 0154 (!) 118/54 mmHg 97.4 F (36.3 C) 72 18 96 %  07/22/14 0000 - - - 18 96 %  07/21/14 2122 (!) 120/52 mmHg 97.3 F (36.3 C) 74 18 96 %  07/21/14 2000 - - - 18 96 %  07/21/14 1600 (!) 112/50 mmHg 97.1 F (36.2 C) 76 18 96 %  07/21/14 1036 (!) 102/54 mmHg 97.4 F (36.3 C) 66 18 95 %  07/21/14 1012 - 97.8 F (36.6 C) - - -  07/21/14 1011 - - 76 18 96 %  07/21/14 1007 - - 76 17 96 %  07/21/14 1002 118/69 mmHg - 80 17 94 %  07/21/14 1000 - - 78 16 94 %  07/21/14 0947 133/68 mmHg - 75 12 95 %  07/21/14 0945 - - 77 17 99 %  07/21/14 0939 - - 67 13  98 %  07/21/14 0936 - - 71 16 98 %  07/21/14 0932 116/73 mmHg - 66 11 94 %  07/21/14 0930 - - 67 10 97 %     Recent laboratory studies:  Recent Labs  07/22/14 0530  WBC 14.0*  HGB 11.6*  HCT 35.0*  PLT 251  NA 140  K 4.5  CL 106  CO2 18*  BUN 9  CREATININE 0.62  GLUCOSE 155*  CALCIUM 9.0     Discharge Medications:     Medication List    STOP taking these medications        aspirin EC 81 MG tablet     rivaroxaban 10 MG Tabs tablet  Commonly known as:  XARELTO      TAKE these medications        acetaminophen 500 MG tablet  Commonly known as:  TYLENOL  Take 1,000 mg by mouth every 6 (six) hours as needed for mild pain or headache.     apixaban 2.5 MG Tabs tablet  Commonly known as:  ELIQUIS  Take 1 tablet (2.5 mg total) by mouth 2 (two) times daily.     atorvastatin 40 MG  tablet  Commonly known as:  LIPITOR  Take 40 mg by mouth daily.     bisacodyl 5 MG EC tablet  Commonly known as:  DULCOLAX  Take 2 tablets every night with dinner until bowel movement.  LAXITIVE.  Restart if two days since last bowel movement     celecoxib 200 MG capsule  Commonly known as:  CELEBREX  1 tab po q day with food for pain and  swelling     DSS 100 MG Caps  1 tab 2 times a day while on narcotics.  STOOL SOFTENER     hyoscyamine 0.375 MG 12 hr tablet  Commonly known as:  LEVBID  Take 0.375 mg by mouth every 12 (twelve) hours as needed for cramping.     loratadine 10 MG tablet  Commonly known as:  CLARITIN  Take 10 mg by mouth daily.     multivitamin with minerals Tabs tablet  Take 1 tablet by mouth See admin instructions. Take 1 tablet Monday - Friday     oxyCODONE 5 MG immediate release tablet  Commonly known as:  Oxy IR/ROXICODONE  1-2 tablets every 4-6 hrs as needed for pain     OxyCODONE 20 mg T12a 12 hr tablet  Commonly known as:  OXYCONTIN  1 tablet 2 times a day  Long Acting Pain Medication     PARoxetine 10 MG tablet  Commonly known as:  PAXIL  Take 1 tablet (10 mg total) by mouth daily.     potassium gluconate 595 MG Tabs tablet  Take 595 mg by mouth daily.     PREVIDENT 5000 BOOSTER 1.1 % Pste  Generic drug:  Sodium Fluoride  Place 1 application onto teeth at bedtime.     propranolol ER 120 MG 24 hr capsule  Commonly known as:  INDERAL LA  Take 120 mg by mouth daily.     SUMAtriptan 100 MG tablet  Commonly known as:  IMITREX  Take 100 mg by mouth every 2 (two) hours as needed for migraine.        Diagnostic Studies: No results found.  Disposition: 06-Home-Health Care Svc      Discharge Instructions    CPM    Complete by:  As directed   Continuous passive motion machine (CPM):      Use  the CPM from 0 to 90 for 6 hours per day.       You may break it up into 2 or 3 sessions per day.      Use CPM for 2 weeks or until you are told to  stop.     Call MD / Call 911    Complete by:  As directed   If you experience chest pain or shortness of breath, CALL 911 and be transported to the hospital emergency room.  If you develope a fever above 101 F, pus (white drainage) or increased drainage or redness at the wound, or calf pain, call your surgeon's office.     Change dressing    Complete by:  As directed   Keep bandage over wound.  Wash whole leg including over the bandage every day with soap and water.     Constipation Prevention    Complete by:  As directed   Drink plenty of fluids.  Prune juice may be helpful.  You may use a stool softener, such as Colace (over the counter) 100 mg twice a day.  Use MiraLax (over the counter) for constipation as needed.     Diet - low sodium heart healthy    Complete by:  As directed      Discharge instructions    Complete by:  As directed   Total Knee Replacement Care After Refer to this sheet in the next few weeks. These discharge instructions provide you with general information on caring for yourself after you leave the hospital. Your caregiver may also give you specific instructions. Your treatment has been planned according to the most current medical practices available, but unavoidable complications sometimes occur. If you have any problems or questions after discharge, please call your caregiver. Regaining a near full range of motion of your knee within the first 3 to 6 weeks after surgery is critical. HOME CARE INSTRUCTIONS  You may resume a normal diet and activities as directed.  Perform exercises as directed.  Place gray foam block, curve side up under heel at all times except when in CPM or when walking.  DO NOT modify, tear, cut, or change in any way the gray foam block. You will receive physical therapy daily  Take showers instead of baths until informed otherwise.  You may shower on when your drain holes quit draining.  Please wash whole leg including wound with soap and  water. Keep bandage over wound.  Wash whole leg including over the bandage every day with soap and water. It is OK to take over-the-counter tylenol in addition to the oxycodone for pain, discomfort, or fever. Oxycodone is VERY constipating.  Please take stool softener twice a day and laxatives daily until bowels are regular Eat a well-balanced diet.  Avoid lifting or driving until you are instructed otherwise.  Make an appointment to see your caregiver for stitches (suture) or staple removal as directed.  If you have been sent home with a continuous passive motion machine (CPM machine), 0-90 degrees 6 hrs a day   2 hrs a shift SEEK MEDICAL CARE IF: You have swelling of your calf or leg.  You develop shortness of breath or chest pain.  You have redness, swelling, or increasing pain in the wound.  There is pus or any unusual drainage coming from the surgical site.  You notice a bad smell coming from the surgical site or dressing.  The surgical site breaks open after sutures or staples have been removed.  There is persistent bleeding from the suture or staple line.  You are getting worse or are not improving.  You have any other questions or concerns.  SEEK IMMEDIATE MEDICAL CARE IF:  You have a fever.  You develop a rash.  You have difficulty breathing.  You develop any reaction or side effects to medicines given.  Your knee motion is decreasing rather than improving.  MAKE SURE YOU:  Understand these instructions.  Will watch your condition.  Will get help right away if you are not doing well or get worse.     Do not put a pillow under the knee. Place it under the heel.    Complete by:  As directed   Place yellow foam block, yellow side up under heel at all times except when in CPM or when walking.  DO NOT modify, tear, cut, or change in any way the yellow foam block.     Increase activity slowly as tolerated    Complete by:  As directed      TED hose    Complete by:  As directed    Use stockings (TED hose) for 2 weeks on both leg(s).  You may remove them at night for sleeping.           Follow-up Information    Follow up with Nilda SimmerWAINER,ROBERT A, MD On 08/04/2014.   Specialty:  Orthopedic Surgery   Why:  appt time 3:45 pm   Contact information:   [address] [place] [address] [phone]        Signed: Pascal LuxSHEPPERSON,Samirah Scarpati J 07/22/2014, 9:25 AM

## 2014-07-22 NOTE — Progress Notes (Signed)
Physical Therapy Treatment Patient Details Name: Heidi Webster MRN: [id number] DOB: September 09, 1960 Today's Date: 07/22/2014    History of Present Illness s/p RTKA    PT Comments    Excelelnt progress; modified independent with functional mobility necessary to dc home; All acute PT goals met; will sign off;   OK for dc home from PT standpoint   Follow Up Recommendations  Home health PT;Supervision/Assistance - 24 hour     Equipment Recommendations  None recommended by PT    Recommendations for Other Services       Precautions / Restrictions Precautions Precautions: Knee Precaution Comments: Pt educated to not allow any pillow or bolster under knee for healing with optimal range of motion.  Restrictions Weight Bearing Restrictions: Yes RLE Weight Bearing: Weight bearing as tolerated    Mobility  Bed Mobility Overal bed mobility: Modified Independent                Transfers Overall transfer level: Modified independent Equipment used: Rolling walker (2 wheeled) Transfers: Sit to/from Stand              Ambulation/Gait Ambulation/Gait assistance: Supervision;Modified independent (Device/Increase time) Ambulation Distance (Feet): 520 Feet Assistive device: Rolling walker (2 wheeled) Gait Pattern/deviations: Step-through pattern     General Gait Details: progressed from supervision to modified independent   Stairs Stairs: Yes Stairs assistance: Supervision Stair Management: No rails;With walker;Step to pattern Number of Stairs: 1 General stair comments: managing well; remembered technaiue from last admission  Wheelchair Mobility    Modified Rankin (Stroke Patients Only)       Balance                                    Cognition Arousal/Alertness: Awake/alert Behavior During Therapy: WFL for tasks assessed/performed Overall Cognitive Status: Within Functional Limits for tasks assessed                      Exercises  Total Joint Exercises Quad Sets: AROM;Right;10 reps Heel Slides: AROM;Right;10 reps Straight Leg Raises: AROM;Right;10 reps Long Arc Quad: AROM;Right;10 reps Goniometric ROM: 0-100 deg    General Comments        Pertinent Vitals/Pain Pain Assessment: 0-10 Pain Score: 4  Pain Location: R knee Pain Descriptors / Indicators: Aching Pain Intervention(s): Monitored during session    Home Living                      Prior Function            PT Goals (current goals can now be found in the care plan section) Acute Rehab PT Goals Patient Stated Goal: back to work Progress towards PT goals: Goals met/education completed, patient discharged from PT    Frequency  7X/week    PT Plan Current plan remains appropriate    Co-evaluation             End of Session   Activity Tolerance: Patient tolerated treatment well Patient left: in chair;with call bell/phone within reach     Time: 0915-0948 PT Time Calculation (min) (ACUTE ONLY): 33 min  Charges:  $Gait Training: 8-22 mins $Therapeutic Exercise: 8-22 mins                    G Codes:      Quin Hoop 07/22/2014, 12:24 PM  Roney Marion, [place] Pager  [phone] Office [phone]

## 2014-07-22 NOTE — Discharge Instructions (Signed)

## 2014-07-22 NOTE — Progress Notes (Signed)
OT Cancellation Note  Patient Details Name: LISHA DAVES MRN: [id number] DOB: 05/13/1961   Cancelled Treatment:    Reason Eval/Treat Not Completed: OT screened, no needs identified, will sign off  [facility], OTR/L  [phone] 07/22/2014 07/22/2014, 11:25 AM

## 2014-07-22 NOTE — Plan of Care (Signed)
Problem: Phase I Progression Outcomes Goal: Dangle or out of bed evening of surgery Outcome: Completed/Met Date Met:  07/22/14  Problem: Phase II Progression Outcomes Goal: Tolerating diet Outcome: Progressing     

## 2014-08-07 ENCOUNTER — Encounter (HOSPITAL_COMMUNITY): Payer: Self-pay | Admitting: Cardiology

## 2014-08-29 DIAGNOSIS — I4729 Other ventricular tachycardia: Secondary | ICD-10-CM

## 2014-08-29 HISTORY — DX: Other ventricular tachycardia: I47.29

## 2015-01-07 DIAGNOSIS — I4729 Other ventricular tachycardia: Secondary | ICD-10-CM | POA: Insufficient documentation

## 2015-02-26 DIAGNOSIS — Z95810 Presence of automatic (implantable) cardiac defibrillator: Secondary | ICD-10-CM

## 2015-02-26 HISTORY — DX: Presence of automatic (implantable) cardiac defibrillator: Z95.810

## 2015-02-26 HISTORY — PX: BI-VENTRICULAR IMPLANTABLE CARDIOVERTER DEFIBRILLATOR  (CRT-D): SHX5747

## 2015-05-07 ENCOUNTER — Other Ambulatory Visit: Payer: Self-pay | Admitting: Obstetrics and Gynecology

## 2015-05-08 LAB — CYTOLOGY - PAP

## 2015-05-27 DIAGNOSIS — Z95810 Presence of automatic (implantable) cardiac defibrillator: Secondary | ICD-10-CM | POA: Insufficient documentation

## 2015-08-18 DIAGNOSIS — I421 Obstructive hypertrophic cardiomyopathy: Secondary | ICD-10-CM | POA: Insufficient documentation

## 2016-08-29 HISTORY — PX: MITRAL VALVE REPAIR: SHX2039

## 2017-04-22 ENCOUNTER — Emergency Department (HOSPITAL_COMMUNITY): Payer: BC Managed Care – PPO

## 2017-04-22 ENCOUNTER — Emergency Department (HOSPITAL_COMMUNITY)
Admission: EM | Admit: 2017-04-22 | Discharge: 2017-04-22 | Disposition: A | Discharge status: Home / Self Care | Payer: BC Managed Care – PPO | Attending: Emergency Medicine | Admitting: Emergency Medicine

## 2017-04-22 ENCOUNTER — Encounter (HOSPITAL_COMMUNITY): Payer: Self-pay

## 2017-04-22 DIAGNOSIS — Z7901 Long term (current) use of anticoagulants: Secondary | ICD-10-CM | POA: Diagnosis not present

## 2017-04-22 DIAGNOSIS — N201 Calculus of ureter: Secondary | ICD-10-CM | POA: Diagnosis not present

## 2017-04-22 DIAGNOSIS — F419 Anxiety disorder, unspecified: Secondary | ICD-10-CM | POA: Diagnosis not present

## 2017-04-22 DIAGNOSIS — Z96653 Presence of artificial knee joint, bilateral: Secondary | ICD-10-CM | POA: Diagnosis not present

## 2017-04-22 DIAGNOSIS — Z79899 Other long term (current) drug therapy: Secondary | ICD-10-CM | POA: Diagnosis not present

## 2017-04-22 DIAGNOSIS — R1031 Right lower quadrant pain: Secondary | ICD-10-CM | POA: Diagnosis present

## 2017-04-22 LAB — BASIC METABOLIC PANEL
Anion gap: 10 (ref 5–15)
BUN: 17 mg/dL (ref 6–20)
CO2: 23 mmol/L (ref 22–32)
Calcium: 9.3 mg/dL (ref 8.9–10.3)
Chloride: 108 mmol/L (ref 101–111)
Creatinine, Ser: 0.94 mg/dL (ref 0.44–1.00)
GFR calc Af Amer: >60 mL/min (ref >60)
Glucose, Bld: 177 mg/dL — ABNORMAL HIGH (ref 65–99)
POTASSIUM: 4.2 mmol/L (ref 3.5–5.1)
Sodium: 141 mmol/L (ref 135–145)

## 2017-04-22 LAB — CBC WITH DIFFERENTIAL/PLATELET
Basophils Absolute: 0 10*3/uL (ref 0.0–0.1)
Basophils Relative: 0 %
EOS PCT: 4 %
Eosinophils Absolute: 0.4 10*3/uL (ref 0.0–0.7)
HCT: 43 % (ref 36.0–46.0)
Hemoglobin: 14.6 g/dL (ref 12.0–15.0)
LYMPHS PCT: 22 %
Lymphs Abs: 2.6 10*3/uL (ref 0.7–4.0)
MCH: 31.7 pg (ref 26.0–34.0)
MCHC: 34 g/dL (ref 30.0–36.0)
MCV: 93.5 fL (ref 78.0–100.0)
Monocytes Absolute: 0.9 10*3/uL (ref 0.1–1.0)
Monocytes Relative: 7 %
NEUTROS PCT: 67 %
Neutro Abs: 7.8 10*3/uL — ABNORMAL HIGH (ref 1.7–7.7)
PLATELETS: 204 10*3/uL (ref 150–400)
RBC: 4.6 MIL/uL (ref 3.87–5.11)
RDW: 12.8 % (ref 11.5–15.5)
WBC: 11.7 10*3/uL — ABNORMAL HIGH (ref 4.0–10.5)

## 2017-04-22 LAB — URINALYSIS, ROUTINE W REFLEX MICROSCOPIC
Bacteria, UA: NONE SEEN
Bilirubin Urine: NEGATIVE
Glucose, UA: NEGATIVE mg/dL
Ketones, ur: NEGATIVE mg/dL
LEUKOCYTES UA: NEGATIVE
Nitrite: NEGATIVE
PH: 5 (ref 5.0–8.0)
Protein, ur: 100 mg/dL — AB
SPECIFIC GRAVITY, URINE: 1.032 — AB (ref 1.005–1.030)

## 2017-04-22 MED ORDER — ONDANSETRON HCL 4 MG/2ML IJ SOLN
4.0000 mg | Freq: Once | INTRAMUSCULAR | Status: AC
  Administered 2017-04-22: 4 mg via INTRAVENOUS

## 2017-04-22 MED ORDER — HYDROCODONE-ACETAMINOPHEN 5-325 MG PO TABS: 1.0000 | ORAL_TABLET | Freq: Four times a day (QID) | ORAL | 0 refills | Status: DC | PRN

## 2017-04-22 MED ORDER — FENTANYL CITRATE (PF) 100 MCG/2ML IJ SOLN
50.0000 ug | Freq: Once | INTRAMUSCULAR | Status: AC
  Administered 2017-04-22: 50 ug via INTRAVENOUS
  Filled 2017-04-22: qty 2

## 2017-04-22 MED ORDER — ONDANSETRON HCL 4 MG/2ML IJ SOLN
INTRAMUSCULAR | Status: AC
  Filled 2017-04-22: qty 2

## 2017-04-22 MED ORDER — HYDROCODONE-ACETAMINOPHEN 5-325 MG PO TABS
1.0000 | ORAL_TABLET | Freq: Once | ORAL | Status: AC
  Administered 2017-04-22: 1 via ORAL
  Filled 2017-04-22: qty 1

## 2017-04-22 MED ORDER — SODIUM CHLORIDE 0.9 % IV BOLUS (SEPSIS)
1000.0000 mL | Freq: Once | INTRAVENOUS | Status: AC
  Administered 2017-04-22: 1000 mL via INTRAVENOUS

## 2017-04-22 MED ORDER — KETOROLAC TROMETHAMINE 30 MG/ML IJ SOLN
15.0000 mg | Freq: Once | INTRAMUSCULAR | Status: AC
  Administered 2017-04-22: 15 mg via INTRAVENOUS
  Filled 2017-04-22: qty 1

## 2017-04-22 MED ORDER — ONDANSETRON HCL 4 MG/2ML IJ SOLN
4.0000 mg | Freq: Once | INTRAMUSCULAR | Status: AC
  Administered 2017-04-22: 4 mg via INTRAVENOUS
  Filled 2017-04-22: qty 2

## 2017-04-22 NOTE — ED Provider Notes (Signed)
[facility]Y DEPT Provider Note   CSN: [id number] Arrival date & time: 04/22/17  0110     History   Chief Complaint Chief Complaint  Patient presents with  . Abdominal Pain    HPI Heidi Webster is a 56 y.o. female.  The history is provided by the patient.  Abdominal Pain   This is a new problem. The current episode started 1 to 2 hours ago. The problem occurs constantly. The problem has been rapidly worsening. The pain is located in the RLQ. The quality of the pain is sharp. The pain is severe. Associated symptoms include nausea and vomiting. Pertinent negatives include fever. The symptoms are aggravated by certain positions. Nothing relieves the symptoms.  pt with h/o HOCM, ICD in place presents with abrupt onset of RLQ pain that radiates to back She has had nausea/vomiting   Past Medical History:  Diagnosis Date  . Anxiety   . Cardiomyopathy, hypertrophic (HCC)    hypertrophic obstructive cardiomyopathy (Dr. Jacinto Halim)  . Complication of anesthesia    slow to wake up, bp drops  . Diverticula of colon 2009  . Dysrhythmia    tachycardia  . Family history of anesthesia complication    son severe nausea and vomiting  . GERD (gastroesophageal reflux disease)    occ   . Heart murmur   . History of kidney stones   . History of kidney stones   . Hx MRSA infection 2009  . Left knee DJD   . Migraines   . Primary localized osteoarthritis of right knee     Patient Active Problem List   Diagnosis Date Noted  . Primary localized osteoarthritis of right knee   . Stiffness of joint, not elsewhere classified, lower leg 04/15/2014  . Difficulty walking 04/15/2014  . Weakness of right lower extremity 04/15/2014  . DJD (degenerative joint disease) of knee 03/10/2014  . Cardiomyopathy, hypertrophic (HCC)   . GERD (gastroesophageal reflux disease)   . Dysrhythmia   . Complication of anesthesia   . Diverticula of colon   . Kidney stone   . Left knee DJD   . Heart murmur   .  Migraines   . Hx MRSA infection     Past Surgical History:  Procedure Laterality Date  . CARDIAC CATHETERIZATION  1/15  . COLON SURGERY  2009   perforated Diverticula  . KNEE ARTHROSCOPY Left 03/10/2014   Procedure: ARTHROSCOPY KNEE;  Surgeon: Nilda Simmer, MD;  Location[facility] OR;  Service: Orthopedics;  Laterality: Left;  . LEFT HEART CATHETERIZATION WITH CORONARY ANGIOGRAM N/A 09/03/2013   Procedure: LEFT HEART CATHETERIZATION WITH CORONARY ANGIOGRAM;  Surgeon: Pamella Pert, MD;  Location[facility] CATH LAB;  Service: Cardiovascular;  Laterality: N/A;  . SHOULDER CAPSULORRHAPHY Right 1991  . TOTAL KNEE ARTHROPLASTY Left 03/10/2014   Procedure: TOTAL KNEE ARTHROPLASTY;  Surgeon: Nilda Simmer, MD;  Location: [facility] OR;  Service: Orthopedics;  Laterality: Left;  . TOTAL KNEE ARTHROPLASTY Right 07/21/2014   Procedure: RIGHT TOTAL KNEE ARTHROPLASTY;  Surgeon: Nilda Simmer, MD;  Location: [facility] OR;  Service: Orthopedics;  Laterality: Right;    OB History    No data available       Home Medications    Prior to Admission medications   Medication Sig Start Date End Date Taking? Authorizing Provider  acetaminophen (TYLENOL) 500 MG tablet Take 1,000 mg by mouth every 6 (six) hours as needed for mild pain or headache.     [provider]  apixaban Everlene Balls)  2.5 MG TABS tablet Take 1 tablet (2.5 mg total) by mouth 2 (two) times daily. 07/22/14   Shepperson, Kirstin, PA-C  atorvastatin (LIPITOR) 40 MG tablet Take 40 mg by mouth daily.    [provider]  bisacodyl (DULCOLAX) 5 MG EC tablet Take 2 tablets every night with dinner until bowel movement.  LAXITIVE.  Restart if two days since last bowel movement Patient not taking: Reported on 07/10/2014 03/11/14   Shepperson, Kirstin, PA-C  celecoxib (CELEBREX) 200 MG capsule 1 tab po q day with food for pain and  swelling 07/22/14   Shepperson, Kirstin, PA-C  docusate sodium 100 MG CAPS 1 tab 2 times a day while on narcotics.  STOOL  SOFTENER Patient not taking: Reported on 07/10/2014 03/11/14   Shepperson, Kirstin, PA-C  hyoscyamine (LEVBID) 0.375 MG 12 hr tablet Take 0.375 mg by mouth every 12 (twelve) hours as needed for cramping.     [provider]  loratadine (CLARITIN) 10 MG tablet Take 10 mg by mouth daily.    [provider]  Multiple Vitamin (MULTIVITAMIN WITH MINERALS) TABS Take 1 tablet by mouth See admin instructions. Take 1 tablet Monday - Friday    [provider]  oxyCODONE (OXY IR/ROXICODONE) 5 MG immediate release tablet 1-2 tablets every 4-6 hrs as needed for pain 07/22/14   Shepperson, Kirstin, PA-C  OxyCODONE (OXYCONTIN) 20 mg T12A 12 hr tablet 1 tablet 2 times a day  Long Acting Pain Medication 07/22/14   Shepperson, Kirstin, PA-C  PARoxetine (PAXIL) 10 MG tablet Take 1 tablet (10 mg total) by mouth daily. 03/11/14   Shepperson, Kirstin, PA-C  potassium gluconate 595 MG TABS tablet Take 595 mg by mouth daily.    [provider]  propranolol ER (INDERAL LA) 120 MG 24 hr capsule Take 120 mg by mouth daily.    [provider]  Sodium Fluoride (PREVIDENT 5000 BOOSTER) 1.1 % PSTE Place 1 application onto teeth at bedtime.     [provider]  SUMAtriptan (IMITREX) 100 MG tablet Take 100 mg by mouth every 2 (two) hours as needed for migraine.     [provider]    Family History Family History  Problem Relation Age of Onset  . Diabetes Mother   . Heart disease Mother        murmur and cardiomyopathy  . Heart disease Father   . Arthritis-Osteo Father     Social History Social History  Substance Use Topics  . Smoking status: Never Smoker  . Smokeless tobacco: Never Used  . Alcohol use Yes     Comment: 1 drink a week     Allergies   Augmentin [amoxicillin-pot clavulanate] and Tape   Review of Systems Review of Systems  Constitutional: Positive for diaphoresis. Negative for fever.  Gastrointestinal: Positive for abdominal pain,  nausea and vomiting.  Psychiatric/Behavioral: The patient is nervous/anxious.   All other systems reviewed and are negative.    Physical Exam Updated Vital Signs BP (!) 141/97   Pulse 70   Temp 97.9 F (36.6 C) (Oral)   Resp 20   Ht 1.575 m (5\' 2" )   Wt 78.5 kg (173 lb)   SpO2 97%   BMI 31.64 kg/m   Physical Exam CONSTITUTIONAL: Anxious, crying HEAD: Normocephalic/atraumatic EYES: EOMI  ENMT: Mucous membranes moist NECK: supple no meningeal signs SPINE/BACK:entire spine nontender CV: S1/S2 noted, no murmurs/rubs/gallops noted LUNGS: Lungs are clear to auscultation bilaterally, no apparent distress ABDOMEN: soft, moderate RLQ tenderness, no rebound  or guarding, bowel sounds noted throughout abdomen GU:no cva tenderness NEURO: Pt is awake/alert/appropriate, moves all extremitiesx4.  No facial droop.   EXTREMITIES: pulses normal/equal, full ROM SKIN: warm, color normal, diaphoretic PSYCH: anxious and crying  ED Treatments / Results  Labs (all labs ordered are listed, but only abnormal results are displayed) Labs Reviewed  BASIC METABOLIC PANEL - Abnormal; Notable for the following:       Result Value   Glucose, Bld 177 (*)    All other components within normal limits  CBC WITH DIFFERENTIAL/PLATELET - Abnormal; Notable for the following:    WBC 11.7 (*)    Neutro Abs 7.8 (*)    All other components within normal limits  URINALYSIS, ROUTINE W REFLEX MICROSCOPIC - Abnormal; Notable for the following:    Color, Urine AMBER (*)    APPearance CLOUDY (*)    Specific Gravity, Urine 1.032 (*)    Hgb urine dipstick LARGE (*)    Protein, ur 100 (*)    Squamous Epithelial / LPF 6-30 (*)    All other components within normal limits    EKG  EKG Interpretation None       Radiology Ct Renal Stone Study  Result Date: 04/22/2017 CLINICAL DATA:  Flank pain, stone disease suspected. Right-sided pain. EXAM: CT ABDOMEN AND PELVIS WITHOUT CONTRAST TECHNIQUE: Multidetector CT  imaging of the abdomen and pelvis was performed following the standard protocol without IV contrast. COMPARISON:  Noncontrast CT 08/13/2012, contrast-enhanced CT 06/07/2011 FINDINGS: Lower chest: Pacemaker wires in the right atrium and ventricle. Minimal scarring in the lingula. No pleural fluid or consolidation. Minimal chronic pericardial thickening anteriorly. Hepatobiliary: Low-density lesion in the right lobe of the liver measures approximately 24 mm, incompletely characterized without contrast, previously characterized as hemangioma. Additional scattered low-density lesions throughout the liver are too small to accurately characterize, likely cysts and hemangiomas. Gallbladder physiologically distended, no calcified stone. No biliary dilatation. Pancreas: No ductal dilatation or inflammation. Spleen: Normal in size without focal abnormality. Adrenals/Urinary Tract: No adrenal nodule. Obstructing 5 x 5 mm stone at the right ureterovesicular junction with moderate hydroureteronephrosis. Mild right perinephric edema. Nonobstructing stone in the lower right kidney. Low-density lesion in the mid right cortex is likely a cyst but incompletely characterized without contrast. No left hydronephrosis. No left renal stones. Left ureter is decompressed. Nondistended urinary bladder without stone. Stomach/Bowel: Small hiatal hernia. Stomach is nondistended. No small bowel inflammation, obstruction or wall thickening. Normal appendix. Submucosal fatty infiltration throughout the colon suggesting chronic/ prior inflammation. No colonic wall thickening or acute inflammatory change. Sigmoid colonic diverticulosis with probable enteric suture in the mid sigmoid. Midline lower ventral abdominal wall hernia with rectus diastasis contains nonobstructed noninflamed bowel. Vascular/Lymphatic: Aortic atherosclerosis. No aneurysm. No abdominal or pelvic adenopathy. Reproductive: Uterus and bilateral adnexa are unremarkable. Other: No  free air, free fluid, or intra-abdominal fluid collection. Lower midline ventral abdominal wall hernia contains noninflamed bowel. Small fat containing umbilical hernia. Musculoskeletal: There are no acute or suspicious osseous abnormalities. Degenerative disc disease at L2-L3 has progressed with Modic endplate changes. Broad-based scoliotic curvature of the spine. IMPRESSION: 1. Obstructing 5 mm stone at the right ureterovesicular junction with moderate hydroureteronephrosis. 2. Nonobstructing stone in the right kidney. 3. Colonic diverticulosis without acute inflammation. 4. Low-density hepatic lesions, incompletely characterized without contrast, and previously characterized as cysts and hemangiomas. 5. Lower midline ventral abdominal wall hernia containing noninflamed nonobstructed small bowel. 6.  Aortic Atherosclerosis (ICD10-I70.0). Electronically Signed   By: Lujean Rave.D.  On: 04/22/2017 02:46    Procedures Procedures (including critical care time)  Medications Ordered in ED Medications  HYDROcodone-acetaminophen (NORCO/VICODIN) 5-325 MG per tablet 1 tablet (not administered)  fentaNYL (SUBLIMAZE) injection 50 mcg (50 mcg Intravenous Given 04/22/17 0150)  ondansetron (ZOFRAN) injection 4 mg (4 mg Intravenous Given 04/22/17 0151)  sodium chloride 0.9 % bolus 1,000 mL (0 mLs Intravenous Stopped 04/22/17 0347)  fentaNYL (SUBLIMAZE) injection 50 mcg (50 mcg Intravenous Given 04/22/17 0225)  ketorolac (TORADOL) 30 MG/ML injection 15 mg (15 mg Intravenous Given 04/22/17 0257)  ondansetron (ZOFRAN) injection 4 mg (4 mg Intravenous Given 04/22/17 0303)     Initial Impression / Assessment and Plan / ED Course  I have reviewed the triage vital signs and the nursing notes.  Pertinent labs & imaging results that were available during my care of the patient were reviewed by me and considered in my medical decision making (see chart for details).     3:51 AM Pt improved She appears more  comfortable 4:18 AM Pt improved She feels comfortable for d/c home Referred to urology Will defer use of flomax due to difficulty managing her BP due to underlying HOCM   Final Clinical Impressions(s) / ED Diagnoses   Final diagnoses:  Right ureteral stone    New Prescriptions New Prescriptions   HYDROCODONE-ACETAMINOPHEN (NORCO/VICODIN) 5-325 MG TABLET    Take 1 tablet by mouth every 6 (six) hours as needed for severe pain.     Zadie Rhine, MD 04/22/17 [phone]

## 2017-04-22 NOTE — ED Triage Notes (Signed)
Pain to rlq and through to her back onset 2 hours ago, stabbing, states is unable to sit still, nausea and vomiting.

## 2017-04-22 NOTE — ED Notes (Signed)
1st dose of pain meds helped initially and then wore off. Pt given 2nd dose of pain meds.

## 2017-06-29 HISTORY — PX: OTHER SURGICAL HISTORY: SHX169

## 2017-07-11 DIAGNOSIS — Z9889 Other specified postprocedural states: Secondary | ICD-10-CM | POA: Insufficient documentation

## 2017-07-11 HISTORY — PX: OTHER SURGICAL HISTORY: SHX169

## 2017-07-11 HISTORY — PX: MITRAL VALVE REPAIR: SHX2039

## 2017-07-12 DIAGNOSIS — E872 Acidosis, unspecified: Secondary | ICD-10-CM | POA: Insufficient documentation

## 2018-10-01 ENCOUNTER — Ambulatory Visit: Payer: Self-pay | Admitting: Cardiology

## 2018-10-25 ENCOUNTER — Ambulatory Visit: Payer: Self-pay | Admitting: Cardiology

## 2018-11-23 ENCOUNTER — Ambulatory Visit: Payer: Self-pay | Admitting: Cardiology

## 2018-11-28 ENCOUNTER — Ambulatory Visit: Payer: Self-pay | Admitting: Cardiology

## 2019-01-02 ENCOUNTER — Encounter: Payer: Self-pay | Admitting: Cardiology

## 2019-01-03 ENCOUNTER — Encounter: Payer: Self-pay | Admitting: Cardiology

## 2019-01-03 ENCOUNTER — Ambulatory Visit: Payer: BC Managed Care – PPO | Admitting: Cardiology

## 2019-01-03 ENCOUNTER — Other Ambulatory Visit: Payer: Self-pay

## 2019-01-03 VITALS — BP 100/70 | HR 78 | Ht 60.0 in | Wt 164.0 lb

## 2019-01-03 DIAGNOSIS — Z9889 Other specified postprocedural states: Secondary | ICD-10-CM | POA: Diagnosis not present

## 2019-01-03 DIAGNOSIS — I472 Ventricular tachycardia: Secondary | ICD-10-CM

## 2019-01-03 DIAGNOSIS — E119 Type 2 diabetes mellitus without complications: Secondary | ICD-10-CM

## 2019-01-03 DIAGNOSIS — Z95810 Presence of automatic (implantable) cardiac defibrillator: Secondary | ICD-10-CM

## 2019-01-03 DIAGNOSIS — I4729 Other ventricular tachycardia: Secondary | ICD-10-CM

## 2019-01-03 DIAGNOSIS — I421 Obstructive hypertrophic cardiomyopathy: Secondary | ICD-10-CM

## 2019-01-03 DIAGNOSIS — Z298 Encounter for other specified prophylactic measures: Secondary | ICD-10-CM

## 2019-01-03 MED ORDER — CLINDAMYCIN HCL 300 MG PO CAPS: 300.0000 mg | ORAL_CAPSULE | ORAL | 4 refills | Status: DC | PRN

## 2019-01-03 NOTE — Progress Notes (Signed)
Virtual Visit via Video Note: This visit type was conducted due to national recommendations for restrictions regarding the COVID-19 Pandemic (e.g. social distancing).  This format is felt to be most appropriate for this patient at this time.  All issues noted in this document were discussed and addressed.  No physical exam was performed (except for noted visual exam findings with Telehealth visits).  The patient has consented to conduct a Telehealth visit and understands insurance will be billed.   I connected with@, on 01/03/19 at  by a video enabled telemedicine application and verified that I am speaking with the correct person using two identifiers.   I discussed the limitations of evaluation and management by telemedicine and the availability of in person appointments. The patient expressed understanding and agreed to proceed.   I have discussed with patient regarding the safety during COVID Pandemic and steps and precautions to be taken including social distancing, frequent hand wash and use of detergent soap, gels with the patient. I asked the patient to avoid touching mouth, nose, eyes, ears with the hands. I encouraged regular walking around the neighborhood and exercise and regular diet, as long as social distancing can be maintained.  Primary Physician/Referring:  Benita Stabile, MD  Patient ID: Nathanial Rancher, female    DOB: May 13, 1961, 58 y.o.   MRN: [id number]  Chief Complaint  Patient presents with  . Follow-up    1 year f/u     HPI: KIYONO HOYE  is a 58 y.o. female  with here on a annual visit and follow-up of hypertrophic obstructive cardiomyopathy hereditary variant, her children have also tested positive genetic 3, but not phenotypically. No history of sudden cardiac death in the family, denies symptoms to suggest sleep apnea.  She underwent myomectomy on 07/11/2017 and  mitral valve repair. She is being followed at hypertrophic cardiomyopathy clinic at [facility] in hypertrophic cardiomyopathy clinic.  She has ICD implantation for primary prevention of sudden cardiac death. Follows [facility] for this.   She has been walking 1.5 miles a day. Minimal dyspnea and occasional palpitations. Does have NSVT on ICD per patient. No syncope.   Past Medical History:  Diagnosis Date  . Abnormal EKG   . Anxiety   . Anxiety   . Cardiomyopathy, hypertrophic (HCC)    hypertrophic obstructive cardiomyopathy (Dr. Jacinto Halim)  . Complication of anesthesia    slow to wake up, bp drops  . Diverticula of colon 2009  . Dysrhythmia    tachycardia  . Family history of anesthesia complication    son severe nausea and vomiting  . GERD (gastroesophageal reflux disease)    occ   . Heart murmur   . History of kidney stones   . History of kidney stones   . Hx MRSA infection 2009  . Hyperlipidemia   . Hypertrophic obstructive cardiomyopathy (HCC)   . Left knee DJD   . Migraines   . Migraines   . NSVT (nonsustained ventricular tachycardia) (HCC)   . Primary localized osteoarthritis of right knee   . SBE (subacute bacterial endocarditis)     Past Surgical History:  Procedure Laterality Date  . BI-VENTRICULAR IMPLANTABLE CARDIOVERTER DEFIBRILLATOR  (CRT-D)    . CARDIAC CATHETERIZATION  1/15  . COLON SURGERY  2009   perforated Diverticula  . INSERT / REPLACE / REMOVE PACEMAKER    . KNEE ARTHROSCOPY Left 03/10/2014   Procedure: ARTHROSCOPY KNEE;  Surgeon: Nilda Simmer, MD;  Location: [facility] OR;  Service: Orthopedics;  Laterality: Left;  . LEFT HEART CATHETERIZATION WITH CORONARY ANGIOGRAM N/A 09/03/2013   Procedure: LEFT HEART CATHETERIZATION WITH CORONARY ANGIOGRAM;  Surgeon: Pamella Pert, MD;  Location: [facility] CATH LAB;  Service: Cardiovascular;  Laterality: N/A;  . MITRAL VALVE REPAIR    . SHOULDER CAPSULORRHAPHY Right 1991  . TOTAL KNEE ARTHROPLASTY Left 03/10/2014   Procedure: TOTAL KNEE ARTHROPLASTY;  Surgeon: Nilda Simmer, MD;  Location: [facility] OR;   Service: Orthopedics;  Laterality: Left;  . TOTAL KNEE ARTHROPLASTY Right 07/21/2014   Procedure: RIGHT TOTAL KNEE ARTHROPLASTY;  Surgeon: Nilda Simmer, MD;  Location: [facility] OR;  Service: Orthopedics;  Laterality: Right;    Social History   Socioeconomic History  . Marital status: Married    Spouse name: Not on file  . Number of children: 2  . Years of education: Not on file  . Highest education level: Not on file  Occupational History  . Not on file  Social Needs  . Financial resource strain: Not on file  . Food insecurity:    Worry: Not on file    Inability: Not on file  . Transportation needs:    Medical: Not on file    Non-medical: Not on file  Tobacco Use  . Smoking status: Never Smoker  . Smokeless tobacco: Never Used  Substance and Sexual Activity  . Alcohol use: Yes    Comment: 1 drink a week  . Drug use: No  . Sexual activity: Not on file  Lifestyle  . Physical activity:    Days per week: Not on file    Minutes per session: Not on file  . Stress: Not on file  Relationships  . Social connections:    Talks on phone: Not on file    Gets together: Not on file    Attends religious service: Not on file    Active member of club or organization: Not on file    Attends meetings of clubs or organizations: Not on file    Relationship status: Not on file  . Intimate partner violence:    Fear of current or ex partner: Not on file    Emotionally abused: Not on file    Physically abused: Not on file    Forced sexual activity: Not on file  Other Topics Concern  . Not on file  Social History Narrative  . Not on file    Current Outpatient Medications on File Prior to Visit  Medication Sig Dispense Refill  . acetaminophen (TYLENOL) 500 MG tablet Take 1,000 mg by mouth every 6 (six) hours as needed for mild pain or headache.     . albuterol (PROAIR HFA) 108 (90 Base) MCG/ACT inhaler Inhale into the lungs every 6 (six) hours as needed for wheezing or shortness of breath.     Marland Kitchen atorvastatin (LIPITOR) 40 MG tablet Take 40 mg by mouth daily.    . bisoprolol (ZEBETA) 10 MG tablet Take 10 mg by mouth daily.    . cetirizine (ZYRTEC) 10 MG tablet Take 10 mg by mouth daily.    . clindamycin (CLEOCIN) 300 MG capsule Take 300 mg by mouth as needed (before dental work).    . docusate sodium 100 MG CAPS 1 tab 2 times a day while on narcotics.  STOOL SOFTENER 60 capsule 0  . Empagliflozin-metFORMIN HCl 12.5-500 MG TABS Take by mouth 2 (two) times a day.    . loratadine (CLARITIN) 10 MG tablet Take 10 mg by mouth daily.    Marland Kitchen  magnesium 30 MG tablet Take 30 mg by mouth 2 (two) times daily.    . Multiple Vitamin (MULTIVITAMIN WITH MINERALS) TABS Take 1 tablet by mouth See admin instructions. Take 1 tablet Monday - Friday    . omeprazole (PRILOSEC) 20 MG capsule Take 20 mg by mouth daily.    Marland Kitchen PARoxetine (PAXIL) 20 MG tablet Take 20 mg by mouth daily.    . potassium gluconate 595 MG TABS tablet Take 595 mg by mouth daily.    . SUMAtriptan (IMITREX) 100 MG tablet Take 100 mg by mouth every 2 (two) hours as needed for migraine.      No current facility-administered medications on file prior to visit.     Review of Systems  Constitution: Negative for chills, decreased appetite, malaise/fatigue and weight gain.  Cardiovascular: Positive for palpitations (occasional). Negative for dyspnea on exertion, leg swelling and syncope.  Endocrine: Negative for cold intolerance.  Hematologic/Lymphatic: Does not bruise/bleed easily.  Musculoskeletal: Negative for joint swelling.  Gastrointestinal: Negative for abdominal pain, anorexia and change in bowel habit.  Neurological: Positive for dizziness (occasionally when she stands up) and headaches (migraine with aura, improved, mostly aura now). Negative for light-headedness.  Psychiatric/Behavioral: Negative for depression and substance abuse.  All other systems reviewed and are negative.     Objective  Blood pressure 100/70, pulse 78,  height 5' (1.524 m), weight 164 lb (74.4 kg). Body mass index is 32.03 kg/m.  Physical exam not performed or limited due to virtual visit. Please see exam details from prior visit is as below.  She appeared well, no acute distress, neck supple.    Physical Exam  Constitutional: She appears well-developed. No distress.  Mildly obese  HENT:  Head: Atraumatic.  Eyes: Conjunctivae are normal.  Neck: Neck supple. No JVD present. No thyromegaly present.  Cardiovascular: Normal rate, regular rhythm, normal heart sounds and intact distal pulses. Exam reveals no gallop.  No murmur heard. Pulmonary/Chest: Effort normal and breath sounds normal.  Abdominal: Soft. Bowel sounds are normal.  Musculoskeletal: Normal range of motion.        General: No edema.  Neurological: She is alert.  Skin: Skin is warm and dry.  Psychiatric: She has a normal mood and affect.   Radiology: No results found.  Laboratory examination:    CMP Latest Ref Rng & Units 04/22/2017 07/22/2014 07/11/2014  Glucose 65 - 99 mg/dL 161(W) 960(A) 89  BUN 6 - 20 mg/dL Creatinine 0.44 - 1.00 mg/dL 5.40 9.81 1.91  Sodium 135 - 145 mmol/L 141 140 141  Potassium 3.5 - 5.1 mmol/L 4.2 4.5 4.2  Chloride 101 - 111 mmol/L 108 106 105  CO2 22 - 32 mmol/L 23 18(L) 25  Calcium 8.9 - 10.3 mg/dL 9.3 9.0 9.7  Total Protein 6.0 - 8.3 g/dL - - 6.8  Total Bilirubin 0.3 - 1.2 mg/dL - - 0.5  Alkaline Phos 39 - 117 U/L - - 88  AST 0 - 37 U/L - - 26  ALT 0 - 35 U/L - - 25   CBC Latest Ref Rng & Units 04/22/2017 07/22/2014 07/11/2014  WBC 4.0 - 10.5 K/uL 11.7(H) 14.0(H) 6.2  Hemoglobin 12.0 - 15.0 g/dL 47.8 11.6(L) 13.7  Hematocrit 36.0 - 46.0 % 43.0 35.0(L) 41.4  Platelets 150 - 400 K/uL 204 251 232   Lipid Panel  No results found for: CHOL, TRIG, HDL, CHOLHDL, VLDL, LDLCALC, LDLDIRECT HEMOGLOBIN A1C No results found for: HGBA1C, MPG TSH No results for  input(s): TSH in the last 8760 hours.  Cardiac Studies:   Coronary  angiogram 09/03/2013 and also 07/11/2017:  Very mild noncritical coronary artery disease. Normal left ventricular systolic function. 70 mmHg intraventricular pressure gradient across the LVOT.   Echocardiogram 03/03/2016: Severe LVH with normal LVEF 55%, elevated LA pressures with diastolic dysfunction. Normal RV systolic function. Mild-moderate MR, trivial TR Assessment   HOCM (hypertrophic obstructive cardiomyopathy) (HCC): Genetic form Pathogenic HCM genetic mutation (MYBPC3 E542Q, [employer], May 2016)  Status post myomectomy  and MV repair: Dr. Romona Curls at [facility] 07/11/2017  SBE (subacute bacterial endocarditis) prophylaxis candidate  Dual Chamber [employer] [phone]  Implantable cardioverter-defibrillator (ICD) in situ 02/26/2015  NSVT (nonsustained ventricular tachycardia) (HCC)  Type 2 diabetes mellitus without complication, without long-term current use of insulin (HCC)  EKG 08/28/2017: Normal sinus rhythm at the rate of 82 bpm, left atrial enlargement, LBBB. No further analysis due to LBBB.  Recommendations:   Patient is here on annual visit and follow-up of hypertrophic cardiomyopathy, she also follows up at [facility] hypertrophic cardiomyopathy clinic. She isessentially asymptomatic since myectomy symptoms of dyspnea has improved significantly and has occasional palpitations.  She is now a diabetic, states that her diabetes is well controlled, lipids are also well controlled.  I discussed with her that I can potentially take over her care of ICD and also clinical follow-up as she is remained stable for the past 2 years however I'm also happy to follow her peripherally.  She has a echocardiogram scheduled for number 2020 at [facility], as I did not see her personally today I would like to see her in 6 months in the office.  I have refilled clindamycin prescription for endocarditis prophylaxis as she is allergic to amoxicillin.  Yates Decamp, MD, [facility] 01/03/2019, 8:40 AM  [facility] [place] Pager: [phone] Office: [phone] If no answer Cell [phone]

## 2019-04-20 ENCOUNTER — Other Ambulatory Visit: Payer: Self-pay

## 2019-04-20 DIAGNOSIS — Z20822 Contact with and (suspected) exposure to covid-19: Secondary | ICD-10-CM

## 2019-04-21 LAB — NOVEL CORONAVIRUS, NAA: SARS-CoV-2, NAA: NOT DETECTED

## 2019-06-26 ENCOUNTER — Ambulatory Visit (INDEPENDENT_AMBULATORY_CARE_PROVIDER_SITE_OTHER): Payer: BC Managed Care – PPO | Admitting: Urology

## 2019-06-26 DIAGNOSIS — M5489 Other dorsalgia: Secondary | ICD-10-CM

## 2019-07-18 ENCOUNTER — Encounter: Payer: Self-pay | Admitting: Cardiology

## 2019-07-19 ENCOUNTER — Ambulatory Visit: Payer: BC Managed Care – PPO | Admitting: Cardiology

## 2019-07-19 ENCOUNTER — Other Ambulatory Visit: Payer: Self-pay

## 2019-07-19 ENCOUNTER — Encounter: Payer: Self-pay | Admitting: Cardiology

## 2019-07-19 VITALS — BP 124/78 | HR 82 | Temp 98.0°F | Ht 60.0 in | Wt 163.0 lb

## 2019-07-19 DIAGNOSIS — I421 Obstructive hypertrophic cardiomyopathy: Secondary | ICD-10-CM

## 2019-07-19 NOTE — Progress Notes (Signed)
Primary Physician/Referring:  Celene Squibb, MD  Patient ID: Heidi Webster, female    DOB: 1961/04/26, 58 y.o.   MRN: [id number]  Chief Complaint  Patient presents with  . Cardiomyopathy  . Atrial Fibrillation  . Follow-up    6 month   HPI: Heidi Webster  is a 58 y.o. female  with here on a annual visit and follow-up of hypertrophic obstructive cardiomyopathy hereditary variant, her children have also tested positive genetic mutation, but not phenotypically. No history of sudden cardiac death in the family, denies symptoms to suggest sleep apnea.  She underwent myomectomy on 07/11/2017 and  mitral valve repair. She is being followed at hypertrophic cardiomyopathy clinic at [facility] in hypertrophic cardiomyopathy clinic. She had PAF and now on Eliquis since 03/23/2019.  She has ICD implantation for primary prevention of sudden cardiac death. Follows [facility] for this.  Does have NSVT on ICD per patient. No syncope.   She has been walking 1.5 miles a day. Minimal dyspnea and occasional palpitations. No chest pain and dyspnea is minimal.   Past Medical History:  Diagnosis Date  . Abnormal EKG   . Anxiety   . Anxiety   . Atrial fibrillation ([place])   . Cardiomyopathy, hypertrophic ([place])    hypertrophic obstructive cardiomyopathy (Dr. Einar Gip)  . Complication of anesthesia    slow to wake up, bp drops  . Diverticula of colon 2009  . Dysrhythmia    tachycardia  . Family history of anesthesia complication    son severe nausea and vomiting  . GERD (gastroesophageal reflux disease)    occ   . Heart murmur   . History of kidney stones   . History of kidney stones   . Hx MRSA infection 2009  . Hyperlipidemia   . Hypertrophic obstructive cardiomyopathy ([place])   . Left knee DJD   . Migraines   . Migraines   . NSVT (nonsustained ventricular tachycardia) ([place])   . Primary localized osteoarthritis of right knee   . SBE (subacute bacterial endocarditis)     Past  Surgical History:  Procedure Laterality Date  . BI-VENTRICULAR IMPLANTABLE CARDIOVERTER DEFIBRILLATOR  (CRT-D)    . CARDIAC CATHETERIZATION  1/15  . COLON SURGERY  2009   perforated Diverticula  . INSERT / REPLACE / REMOVE PACEMAKER    . KNEE ARTHROSCOPY Left 03/10/2014   Procedure: ARTHROSCOPY KNEE;  Surgeon: Lorn Junes, MD;  Location: [place];  Service: Orthopedics;  Laterality: Left;  . LEFT HEART CATHETERIZATION WITH CORONARY ANGIOGRAM N/A 09/03/2013   Procedure: LEFT HEART CATHETERIZATION WITH CORONARY ANGIOGRAM;  Surgeon: Laverda Page, MD;  Location: [facility] CATH LAB;  Service: Cardiovascular;  Laterality: N/A;  . MITRAL VALVE REPAIR    . SHOULDER CAPSULORRHAPHY Right 1991  . TOTAL KNEE ARTHROPLASTY Left 03/10/2014   Procedure: TOTAL KNEE ARTHROPLASTY;  Surgeon: Lorn Junes, MD;  Location: [place];  Service: Orthopedics;  Laterality: Left;  . TOTAL KNEE ARTHROPLASTY Right 07/21/2014   Procedure: RIGHT TOTAL KNEE ARTHROPLASTY;  Surgeon: Lorn Junes, MD;  Location: [place];  Service: Orthopedics;  Laterality: Right;    Social History   Socioeconomic History  . Marital status: Married    Spouse name: Not on file  . Number of children: 2  . Years of education: Not on file  . Highest education level: Not on file  Occupational History  . Not on file  Social Needs  . Financial resource strain: Not on file  .  Food insecurity    Worry: Not on file    Inability: Not on file  . Transportation needs    Medical: Not on file    Non-medical: Not on file  Tobacco Use  . Smoking status: Never Smoker  . Smokeless tobacco: Never Used  Substance and Sexual Activity  . Alcohol use: Yes    Comment: 1 drink a week  . Drug use: No  . Sexual activity: Not on file  Lifestyle  . Physical activity    Days per week: Not on file    Minutes per session: Not on file  . Stress: Not on file  Relationships  . Social Herbalist on phone: Not on file    Gets together: Not on file     Attends religious service: Not on file    Active member of club or organization: Not on file    Attends meetings of clubs or organizations: Not on file    Relationship status: Not on file  . Intimate partner violence    Fear of current or ex partner: Not on file    Emotionally abused: Not on file    Physically abused: Not on file    Forced sexual activity: Not on file  Other Topics Concern  . Not on file  Social History Narrative  . Not on file    Current Outpatient Medications on File Prior to Visit  Medication Sig Dispense Refill  . acetaminophen (TYLENOL) 500 MG tablet Take 1,000 mg by mouth every 6 (six) hours as needed for mild pain or headache.     . albuterol (PROAIR HFA) 108 (90 Base) MCG/ACT inhaler Inhale into the lungs every 6 (six) hours as needed for wheezing or shortness of breath.    Marland Kitchen apixaban (ELIQUIS) 5 MG TABS tablet Take 5 mg by mouth 2 (two) times daily.    Marland Kitchen atorvastatin (LIPITOR) 40 MG tablet Take 40 mg by mouth daily.    . bisoprolol (ZEBETA) 10 MG tablet Take 10 mg by mouth 2 (two) times daily.    . cetirizine (ZYRTEC) 10 MG tablet Take 10 mg by mouth daily.    . clindamycin (CLEOCIN) 300 MG capsule Take 1 capsule (300 mg total) by mouth as needed (1 hour before dental work endocarditis prophylaxis). 2 capsule 4  . docusate sodium 100 MG CAPS 1 tab 2 times a day while on narcotics.  STOOL SOFTENER 60 capsule 0  . Empagliflozin-metFORMIN HCl 12.5-500 MG TABS Take by mouth 2 (two) times a day.    . magnesium 30 MG tablet Take 30 mg by mouth daily.    . Multiple Vitamin (MULTIVITAMIN WITH MINERALS) TABS Take 1 tablet by mouth See admin instructions. Take 1 tablet Monday - Friday    . omeprazole (PRILOSEC) 20 MG capsule Take 20 mg by mouth 2 (two) times daily.    Marland Kitchen PARoxetine (PAXIL) 20 MG tablet Take 20 mg by mouth daily.    . potassium gluconate 595 MG TABS tablet Take 595 mg by mouth daily.    . Semaglutide 7 MG TABS Take by mouth daily.    . SUMAtriptan  (IMITREX) 100 MG tablet Take 100 mg by mouth every 2 (two) hours as needed for migraine.      No current facility-administered medications on file prior to visit.     Review of Systems  Constitution: Negative for chills, decreased appetite, malaise/fatigue and weight gain.  Cardiovascular: Positive for palpitations (occasional). Negative for dyspnea on exertion,  leg swelling and syncope.  Endocrine: Negative for cold intolerance.  Hematologic/Lymphatic: Does not bruise/bleed easily.  Musculoskeletal: Negative for joint swelling.  Gastrointestinal: Negative for abdominal pain, anorexia and change in bowel habit.  Neurological: Positive for headaches (migraine with aura, improved, mostly aura now). Negative for dizziness and light-headedness.  Psychiatric/Behavioral: Negative for depression and substance abuse.  All other systems reviewed and are negative.     Objective  Blood pressure (!) 141/83, pulse 82, temperature 98 F (36.7 C), height 5' (1.524 m), weight 163 lb (73.9 kg), SpO2 98 %. Body mass index is 31.83 kg/m.   Physical Exam  Constitutional: She appears well-developed. No distress.  Mildly obese  HENT:  Head: Atraumatic.  Eyes: Conjunctivae are normal.  Neck: Neck supple. No JVD present. No thyromegaly present.  Cardiovascular: Normal rate, regular rhythm, normal heart sounds and intact distal pulses. Exam reveals no gallop.  No murmur heard. Pulmonary/Chest: Effort normal and breath sounds normal.  Abdominal: Soft. Bowel sounds are normal.  Musculoskeletal: Normal range of motion.        General: No edema.  Neurological: She is alert.  Skin: Skin is warm and dry.  Psychiatric: She has a normal mood and affect.   Radiology: No results found.  Laboratory examination:   Labs 06/04/2019: HB 14.2/HCT 44.6, WBC 8.6, platelets 280.  Serum glucose 19 mg, BUN 9, creatinine 0.64, eGFR 114.  Potassium 4.2.  CMP otherwise normal.  Total cholesterol 150, triglycerides 117,  HDL 46, LDL 83.  A1c 6.1%.  CMP Latest Ref Rng & Units 04/22/2017 07/22/2014 07/11/2014  Glucose 65 - 99 mg/dL 177(H) 155(H) 89  BUN 6 - 20 mg/dL '17 9 10  '$ Creatinine 0.44 - 1.00 mg/dL 0.94 0.62 0.69  Sodium 135 - 145 mmol/L 141 140 141  Potassium 3.5 - 5.1 mmol/L 4.2 4.5 4.2  Chloride 101 - 111 mmol/L 108 106 105  CO2 22 - 32 mmol/L 23 18(L) 25  Calcium 8.9 - 10.3 mg/dL 9.3 9.0 9.7  Total Protein 6.0 - 8.3 g/dL - - 6.8  Total Bilirubin 0.3 - 1.2 mg/dL - - 0.5  Alkaline Phos 39 - 117 U/L - - 88  AST 0 - 37 U/L - - 26  ALT 0 - 35 U/L - - 25   CBC Latest Ref Rng & Units 04/22/2017 07/22/2014 07/11/2014  WBC 4.0 - 10.5 K/uL 11.7(H) 14.0(H) 6.2  Hemoglobin 12.0 - 15.0 g/dL 14.6 11.6(L) 13.7  Hematocrit 36.0 - 46.0 % 43.0 35.0(L) 41.4  Platelets 150 - 400 K/uL 204 251 232   Lipid Panel  No results found for: CHOL, TRIG, HDL, CHOLHDL, VLDL, LDLCALC, LDLDIRECT HEMOGLOBIN A1C No results found for: HGBA1C, MPG TSH No results for input(s): TSH in the last 8760 hours.  Cardiac Studies:   Coronary angiogram 09/03/2013 and also 07/11/2017:  Very mild noncritical coronary artery disease. Normal left ventricular systolic function. 70 mmHg intraventricular pressure gradient across the LVOT.   Echocardiogram 03/03/2016: Severe LVH with normal LVEF 55%, elevated LA pressures with diastolic dysfunction. Normal RV systolic function. Mild-moderate MR, trivial TR Assessment   HOCM (hypertrophic obstructive cardiomyopathy) ([place]): Genetic form Pathogenic HCM genetic mutation (MYBPC3 E542Q, [employer], May 2016) - Plan: EKG 12-Lead  EKG 07/19/2019: Normal sinus rhythm at the rate of 76 bpm, left atrial enlargement, normal axis.  IVCD, incomplete LBBB.  LVH with repolarization abnormality, cannot exclude inferior and lateral ischemia.  Normal QT interval. No significant change from previous.   Recommendations:   Heidi Webster  is a 58 y.o. Caucasian female  her dietary hypertrophic obstructive cardiomyopathy,  S/P ICD implantation for NSVT and primary prevention of sudden cardiac death, history of myomectomy 08/02/17 and mitral valve repair at [facility], continues to follow with hypertrophic cardiomyopathy clinic. Hypertension and DM and hyperlipidemia. She also sees me on an annual basis here.  She now has been diagnosed with PAF since 03/23/2019 and is on Eliquis.  Patient is here on 6 month  visit and follow-up of hypertrophic cardiomyopathy, she also follows up at [facility] hypertrophic cardiomyopathy clinic. She isessentially asymptomatic since myectomy symptoms of dyspnea has improved significantly. DM is also well controlled, lipids are also well controlled. Reviewed her labs.   OV in 1 year.  Adrian Prows, MD, [facility] 07/19/2019, 9:42 AM [facility]. [place] Pager: [phone] Office: [phone] If no answer Cell [phone]

## 2019-09-23 ENCOUNTER — Other Ambulatory Visit: Payer: Self-pay

## 2019-09-23 MED ORDER — ATORVASTATIN CALCIUM 40 MG PO TABS: 40.0000 mg | ORAL_TABLET | Freq: Every day | ORAL | 3 refills | Status: AC

## 2020-01-24 ENCOUNTER — Other Ambulatory Visit: Payer: Self-pay | Admitting: Cardiology

## 2020-01-24 DIAGNOSIS — Z298 Encounter for other specified prophylactic measures: Secondary | ICD-10-CM

## 2020-07-20 ENCOUNTER — Ambulatory Visit: Payer: BC Managed Care – PPO | Admitting: Cardiology

## 2020-09-17 ENCOUNTER — Other Ambulatory Visit: Payer: Self-pay | Admitting: Cardiology

## 2021-01-25 ENCOUNTER — Ambulatory Visit
Admission: EM | Admit: 2021-01-25 | Discharge: 2021-01-25 | Disposition: A | Discharge status: Home / Self Care | Payer: BC Managed Care – PPO

## 2021-01-25 ENCOUNTER — Encounter: Payer: Self-pay | Admitting: Emergency Medicine

## 2021-01-25 ENCOUNTER — Other Ambulatory Visit: Payer: Self-pay

## 2021-01-25 ENCOUNTER — Ambulatory Visit: Payer: Self-pay

## 2021-01-25 DIAGNOSIS — L089 Local infection of the skin and subcutaneous tissue, unspecified: Secondary | ICD-10-CM

## 2021-01-25 DIAGNOSIS — S30861A Insect bite (nonvenomous) of abdominal wall, initial encounter: Secondary | ICD-10-CM | POA: Diagnosis not present

## 2021-01-25 DIAGNOSIS — W57XXXA Bitten or stung by nonvenomous insect and other nonvenomous arthropods, initial encounter: Secondary | ICD-10-CM

## 2021-01-25 MED ORDER — DOXYCYCLINE HYCLATE 100 MG PO CAPS: 100.0000 mg | ORAL_CAPSULE | Freq: Two times a day (BID) | ORAL | 0 refills | Status: DC

## 2021-01-25 MED ORDER — DOXYCYCLINE HYCLATE 100 MG PO CAPS: 100.0000 mg | ORAL_CAPSULE | Freq: Two times a day (BID) | ORAL | 0 refills | Status: AC

## 2021-01-25 NOTE — ED Triage Notes (Signed)
Tick bite on left side, she noticed the tick yesterday.  Patient's husband pulled the tick off.

## 2021-01-25 NOTE — ED Provider Notes (Signed)
[facility]    CSN: [id number] Arrival date & time: 01/25/21  0854      History   Chief Complaint No chief complaint on file.   HPI Heidi Webster is a 60 y.o. female.   HPI Patient presents today for prophylactic treatment of tickborne illness.  Patient has been outdoors all weekend working in the yard yesterday noticed a deer tick attached to her abdominal wall.  The deer tick was moved completely intact.  She has a small area of erythema and induration resulting from the tick bite. Patient has an extensive heart history and is concerned for a coronary tickborne illness and request treatment today Past Medical History:  Diagnosis Date  . Abnormal EKG   . Anxiety   . Anxiety   . Atrial fibrillation (HCC)   . Cardiomyopathy, hypertrophic (HCC)    hypertrophic obstructive cardiomyopathy (Dr. Jacinto Halim)  . Complication of anesthesia    slow to wake up, bp drops  . Diverticula of colon 2009  . Dysrhythmia    tachycardia  . Family history of anesthesia complication    son severe nausea and vomiting  . GERD (gastroesophageal reflux disease)    occ   . Heart murmur   . History of kidney stones   . History of kidney stones   . Hx MRSA infection 2009  . Hyperlipidemia   . Hypertrophic obstructive cardiomyopathy (HCC)   . Left knee DJD   . Migraines   . Migraines   . NSVT (nonsustained ventricular tachycardia) (HCC)   . Primary localized osteoarthritis of right knee   . SBE (subacute bacterial endocarditis)     Patient Active Problem List   Diagnosis Date Noted  . Metabolic acidosis 07/12/2017  . Status post myomectomy  and MV repair: Dr. Romona Curls at [facility] 07/11/2017 07/11/2017  . 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 Hypertrophic obstructive cardiomyopathy (HOCM) (HCC) 08/18/2015  . ICD (implantable cardioverter-defibrillator) in place 05/27/2015  . NSVT (nonsustained ventricular tachycardia) (HCC) 01/07/2015  . Primary localized osteoarthritis of right  knee   . Stiffness of joint, not elsewhere classified, lower leg 04/15/2014  . Difficulty walking 04/15/2014  . Weakness of right lower extremity 04/15/2014  . DJD (degenerative joint disease) of knee 03/10/2014  . Cardiomyopathy, hypertrophic (HCC)   . GERD (gastroesophageal reflux disease)   . Dysrhythmia   . Complication of anesthesia   . Diverticula of colon   . Kidney stone   . Left knee DJD   . Heart murmur   . Migraines   . Hx MRSA infection     Past Surgical History:  Procedure Laterality Date  . BI-VENTRICULAR IMPLANTABLE CARDIOVERTER DEFIBRILLATOR  (CRT-D)    . CARDIAC CATHETERIZATION  1/15  . COLON SURGERY  2009   perforated Diverticula  . INSERT / REPLACE / REMOVE PACEMAKER    . KNEE ARTHROSCOPY Left 03/10/2014   Procedure: ARTHROSCOPY KNEE;  Surgeon: Nilda Simmer, MD;  Location: [facility] OR;  Service: Orthopedics;  Laterality: Left;  . LEFT HEART CATHETERIZATION WITH CORONARY ANGIOGRAM N/A 09/03/2013   Procedure: LEFT HEART CATHETERIZATION WITH CORONARY ANGIOGRAM;  Surgeon: Pamella Pert, MD;  Location: [facility] CATH LAB;  Service: Cardiovascular;  Laterality: N/A;  . MITRAL VALVE REPAIR    . SHOULDER CAPSULORRHAPHY Right 1991  . TOTAL KNEE ARTHROPLASTY Left 03/10/2014   Procedure: TOTAL KNEE ARTHROPLASTY;  Surgeon: Nilda Simmer, MD;  Location: [facility] OR;  Service: Orthopedics;  Laterality: Left;  . TOTAL KNEE ARTHROPLASTY Right 07/21/2014  Procedure: RIGHT TOTAL KNEE ARTHROPLASTY;  Surgeon: Nilda Simmer, MD;  Location: [facility] OR;  Service: Orthopedics;  Laterality: Right;    OB History   No obstetric history on file.      Home Medications    Prior to Admission medications   Medication Sig Start Date End Date Taking? Authorizing Provider  rivaroxaban (XARELTO) 10 MG TABS tablet Take 10 mg by mouth in the morning and at bedtime.   Yes [provider]  acetaminophen (TYLENOL) 500 MG tablet Take 1,000 mg by mouth every 6 (six) hours as needed for mild pain or  headache.     [provider]  albuterol (PROAIR HFA) 108 (90 Base) MCG/ACT inhaler Inhale into the lungs every 6 (six) hours as needed for wheezing or shortness of breath.    [provider]  apixaban (ELIQUIS) 5 MG TABS tablet Take 5 mg by mouth 2 (two) times daily.    [provider]  atorvastatin (LIPITOR) 40 MG tablet Take 1 tablet (40 mg total) by mouth daily. 09/23/19   Yates Decamp, MD  bisoprolol (ZEBETA) 10 MG tablet Take 10 mg by mouth 2 (two) times daily.    [provider]  cetirizine (ZYRTEC) 10 MG tablet Take 10 mg by mouth daily.    [provider]  clindamycin (CLEOCIN) 300 MG capsule TAKE 1 CAPSULE BY MOUTH AS NEEDED (1 HOUR BEFORE DENTAL WORK ENDOCARDITIS PROPHYLAXIS) 01/24/20   Yates Decamp, MD  docusate sodium 100 MG CAPS 1 tab 2 times a day while on narcotics.  STOOL SOFTENER 03/11/14   Shepperson, Kirstin, PA-C  doxycycline (VIBRAMYCIN) 100 MG capsule Take 1 capsule (100 mg total) by mouth 2 (two) times daily for 7 days. 01/25/21 02/01/21  Bing Neighbors, FNP  Empagliflozin-metFORMIN HCl 12.5-500 MG TABS Take by mouth 2 (two) times a day.    [provider]  magnesium 30 MG tablet Take 30 mg by mouth daily.    [provider]  Multiple Vitamin (MULTIVITAMIN WITH MINERALS) TABS Take 1 tablet by mouth See admin instructions. Take 1 tablet Monday - Friday    [provider]  omeprazole (PRILOSEC) 20 MG capsule Take 20 mg by mouth 2 (two) times daily.    [provider]  PARoxetine (PAXIL) 20 MG tablet Take 20 mg by mouth daily.    [provider]  potassium gluconate 595 MG TABS tablet Take 595 mg by mouth daily.    [provider]  Semaglutide 7 MG TABS Take by mouth daily.    [provider]  SUMAtriptan (IMITREX) 100 MG tablet Take 100 mg by mouth every 2 (two) hours as needed for migraine.     [provider]    Family History Family History  Problem Relation Age  of Onset  . Diabetes Mother   . Heart disease Mother        murmur and cardiomyopathy  . Heart disease Father   . Arthritis-Osteo Father     Social History Social History   Tobacco Use  . Smoking status: Never Smoker  . Smokeless tobacco: Never Used  Vaping Use  . Vaping Use: Never used  Substance Use Topics  . Alcohol use: Yes    Comment: 1 drink a week  . Drug use: No     Allergies   Amoxicillin-pot clavulanate and Tape   Review of Systems Review of Systems Pertinent negatives listed in HPI  Physical Exam Triage Vital Signs ED Triage Vitals [01/25/21 0950]  Enc Vitals Group     BP 137/76     Pulse Rate 74     Resp 16     Temp 98.1 F (36.7 C)     Temp Source Oral     SpO2 94 %     Weight      Height      Head Circumference      Peak Flow      Pain Score 0     Pain Loc      Pain Edu?      Excl. in GC?    No data found.  Updated Vital Signs BP 137/76 (BP Location: Right Arm)   Pulse 74   Temp 98.1 F (36.7 C) (Oral)   Resp 16   SpO2 94%   Visual Acuity Right Eye Distance:   Left Eye Distance:   Bilateral Distance:    Right Eye Near:   Left Eye Near:    Bilateral Near:     Physical Exam  General appearance: alert, well developed, well nourished, cooperative and in no distress Head: Normocephalic, without obvious abnormality, atraumatic Respiratory: Respirations even and unlabored, normal respiratory rate Heart: rate and rhythm normal. No gallop or murmurs noted on exam  Abdomen: 3 mm erythematous papule indurated lesion present left lateral abdominal wall, no distention, no rebound tenderness, or no mass Extremities: No gross deformities Skin: Skin color, texture, turgor normal. No rashes seen  Psych: Appropriate mood and affect.   UC Treatments / Results  Labs (all labs ordered are listed, but only abnormal results are displayed) Labs Reviewed - No data to display  EKG   Radiology No results found.  Procedures Procedures  (including critical care time)  Medications Ordered in UC Medications - No data to display  Initial Impression / Assessment and Plan / UC Course  I have reviewed the triage vital signs and the nursing notes.  Pertinent labs & imaging results that were available during my care of the patient were reviewed by me and considered in my medical decision making (see chart for details).     Infected insect bite of the abdominal wall resulting from a tick bite.  Prophylactic treatment for tickborne illness prescribed today with doxycycline 100 mg twice daily for 7 days.  Patient advised to continue to monitor for worsening symptoms return precautions given.  Final Clinical Impressions(s) / UC Diagnoses   Final diagnoses:  Infected insect bite of abdomen, initial encounter  Tick bite of abdominal wall, initial encounter   Discharge Instructions   None    ED Prescriptions    Medication Sig Dispense Auth. Provider   doxycycline (VIBRAMYCIN) 100 MG capsule  (Status: Discontinued) Take 1 capsule (100 mg total) by mouth 2 (two) times daily for 7 days. 20 capsule Bing Neighbors, FNP   doxycycline (VIBRAMYCIN) 100 MG capsule Take 1 capsule (100 mg total) by mouth 2 (two) times daily for 7 days. 14 capsule Bing Neighbors, FNP     PDMP not reviewed this encounter.   Bing Neighbors, FNP 01/25/21 1025

## 2021-03-05 ENCOUNTER — Other Ambulatory Visit: Payer: Self-pay | Admitting: Physical Medicine & Rehabilitation

## 2021-03-05 DIAGNOSIS — M545 Low back pain, unspecified: Secondary | ICD-10-CM

## 2021-03-12 ENCOUNTER — Other Ambulatory Visit: Payer: Self-pay

## 2021-03-12 ENCOUNTER — Ambulatory Visit
Admission: RE | Admit: 2021-03-12 | Discharge: 2021-03-12 | Disposition: A | Discharge status: Home / Self Care | Payer: BC Managed Care – PPO | Source: Ambulatory Visit | Attending: Physical Medicine & Rehabilitation | Admitting: Physical Medicine & Rehabilitation

## 2021-03-12 DIAGNOSIS — M545 Low back pain, unspecified: Secondary | ICD-10-CM

## 2022-04-29 ENCOUNTER — Other Ambulatory Visit: Payer: Self-pay | Admitting: Obstetrics and Gynecology

## 2022-04-29 DIAGNOSIS — R928 Other abnormal and inconclusive findings on diagnostic imaging of breast: Secondary | ICD-10-CM

## 2022-05-24 ENCOUNTER — Other Ambulatory Visit: Payer: BC Managed Care – PPO

## 2022-06-02 ENCOUNTER — Ambulatory Visit: Payer: BC Managed Care – PPO

## 2022-06-02 ENCOUNTER — Ambulatory Visit
Admission: RE | Admit: 2022-06-02 | Discharge: 2022-06-02 | Disposition: A | Discharge status: Home / Self Care | Payer: BC Managed Care – PPO | Source: Ambulatory Visit | Attending: Obstetrics and Gynecology | Admitting: Obstetrics and Gynecology

## 2022-06-02 DIAGNOSIS — R928 Other abnormal and inconclusive findings on diagnostic imaging of breast: Secondary | ICD-10-CM

## 2024-04-02 ENCOUNTER — Other Ambulatory Visit: Payer: Self-pay | Admitting: Medical Genetics

## 2024-04-03 ENCOUNTER — Other Ambulatory Visit (HOSPITAL_COMMUNITY)
Admission: RE | Admit: 2024-04-03 | Discharge: 2024-04-03 | Disposition: A | Discharge status: Home / Self Care | Payer: Self-pay | Source: Ambulatory Visit | Attending: Medical Genetics | Admitting: Medical Genetics

## 2024-04-14 LAB — GENECONNECT MOLECULAR SCREEN: Genetic Analysis Overall Interpretation: NEGATIVE

## 2024-05-05 ENCOUNTER — Ambulatory Visit
Admission: EM | Admit: 2024-05-05 | Discharge: 2024-05-05 | Disposition: A | Discharge status: Home / Self Care | Attending: Nurse Practitioner | Admitting: Nurse Practitioner

## 2024-05-05 DIAGNOSIS — N3001 Acute cystitis with hematuria: Secondary | ICD-10-CM | POA: Diagnosis present

## 2024-05-05 LAB — POCT URINE DIPSTICK
Bilirubin, UA: NEGATIVE
Glucose, UA: >=1000 mg/dL — AB
Ketones, POC UA: NEGATIVE mg/dL
Nitrite, UA: NEGATIVE
POC PROTEIN,UA: 30 — AB
Spec Grav, UA: 1.02
Urobilinogen, UA: 0.2 U/dL
pH, UA: 5.5

## 2024-05-05 MED ORDER — NITROFURANTOIN MONOHYD MACRO 100 MG PO CAPS: 100.0000 mg | ORAL_CAPSULE | Freq: Two times a day (BID) | ORAL | 0 refills | Status: DC

## 2024-05-05 MED ORDER — PHENAZOPYRIDINE HCL 200 MG PO TABS: 200.0000 mg | ORAL_TABLET | ORAL | 0 refills | Status: AC

## 2024-05-05 NOTE — Discharge Instructions (Addendum)
 You were seen today for symptoms consistent with a urinary tract infection (UTI). You have been prescribed Macrobid  to treat the infection and Pyridium  to help relieve discomfort such as burning, urgency, and bladder pressure. Take the antibiotics exactly as prescribed and complete the full course, even if you start feeling better. Pyridium  may cause your urine to change color, which is a normal side effect of the medication. A urine culture has been sent to identify the specific bacteria causing the infection and to confirm that the prescribed antibiotic is appropriate. You will only be contacted if your results are abnormal; otherwise, you may review them in your MyChart account.   It is important to stay well hydrated by drinking plenty of fluids throughout the day. This helps flush out your urinary system and keeps your urine light yellow, which is a sign of good hydration. Avoid caffeine and alcohol, as they can irritate the bladder. Be sure to urinate regularly and empty your bladder fully. Do not hold your urine for extended periods. Always wipe from front to back after using the bathroom and use a clean tissue for each wipe. It is also important to urinate after sexual activity. Avoid douching or using sprays or powders in the genital area, as these can cause irritation. Follow up with your healthcare provider if your symptoms do not improve within a few days, get worse, or return after completing your treatment.

## 2024-05-05 NOTE — ED Triage Notes (Signed)
 Pt reports urinary discomfort,, urinary urgency and frequency, lower back pain  x 1 week intermittently.

## 2024-05-05 NOTE — ED Provider Notes (Signed)
 [facility]    CSN: [id number] Arrival date & time: 05/05/24  1250      History   Chief Complaint No chief complaint on file.   HPI Heidi Webster is a 63 y.o. female.   Discussed the use of AI scribe software for clinical note transcription with the patient, who gave verbal consent to proceed.   Patient presents with symptoms of a urinary tract infection (UTI) that have been ongoing, intermittently for one week.  The patient reports urinary urgency and frequency, describing the symptoms as coming and going over the past week. The patient denies experiencing nausea, vomiting, abdominal pain, or fevers. She reports a little back pain but notes that this is a frequent occurrence for her and not necessarily related to the current symptoms.   The following portions of the patient's history were reviewed and updated as appropriate: allergies, current medications, past family history, past medical history, past social history, past surgical history, and problem list.    Past Medical History:  Diagnosis Date   Abnormal EKG    Anxiety    Anxiety    Atrial fibrillation (HCC)    Cardiomyopathy, hypertrophic (HCC)    hypertrophic obstructive cardiomyopathy (Dr. Ladona)   Complication of anesthesia    slow to wake up, bp drops   Diverticula of colon 2009   Dysrhythmia    tachycardia   Family history of anesthesia complication    son severe nausea and vomiting   GERD (gastroesophageal reflux disease)    occ    Heart murmur    History of kidney stones    History of kidney stones    Hx MRSA infection 2009   Hyperlipidemia    Hypertrophic obstructive cardiomyopathy (HCC)    Left knee DJD    Migraines    Migraines    NSVT (nonsustained ventricular tachycardia) (HCC)    Primary localized osteoarthritis of right knee    SBE (subacute bacterial endocarditis)     Patient Active Problem List   Diagnosis Date Noted   Metabolic acidosis 07/12/2017   Status post myomectomy   and MV repair: Dr. 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   NSVT (nonsustained ventricular tachycardia) (HCC) 01/07/2015   Primary localized osteoarthritis of right knee    Stiffness of joint, not elsewhere classified, lower leg 04/15/2014   Difficulty walking 04/15/2014   Weakness of right lower extremity 04/15/2014   DJD (degenerative joint disease) of knee 03/10/2014   Cardiomyopathy, hypertrophic (HCC)    GERD (gastroesophageal reflux disease)    Dysrhythmia    Complication of anesthesia    Diverticula of colon    Kidney stone    Left knee DJD    Heart murmur    Migraines    Hx MRSA infection     Past Surgical History:  Procedure Laterality Date   BI-VENTRICULAR IMPLANTABLE CARDIOVERTER DEFIBRILLATOR  (CRT-D)     CARDIAC CATHETERIZATION  1/15   COLON SURGERY  2009   perforated Diverticula   INSERT / REPLACE / REMOVE PACEMAKER     KNEE ARTHROSCOPY Left 03/10/2014   Procedure: ARTHROSCOPY KNEE;  Surgeon: Lamar DELENA Millman, MD;  Location: [facility] OR;  Service: Orthopedics;  Laterality: Left;   LEFT HEART CATHETERIZATION WITH CORONARY ANGIOGRAM N/A 09/03/2013   Procedure: LEFT HEART CATHETERIZATION WITH CORONARY ANGIOGRAM;  Surgeon: Erick JONELLE Ladona, MD;  Location: [facility] CATH  LAB;  Service: Cardiovascular;  Laterality: N/A;   MITRAL VALVE REPAIR     SHOULDER CAPSULORRHAPHY Right 1991   TOTAL KNEE ARTHROPLASTY Left 03/10/2014   Procedure: TOTAL KNEE ARTHROPLASTY;  Surgeon: Lamar DELENA Millman, MD;  Location: [facility] OR;  Service: Orthopedics;  Laterality: Left;   TOTAL KNEE ARTHROPLASTY Right 07/21/2014   Procedure: RIGHT TOTAL KNEE ARTHROPLASTY;  Surgeon: Lamar DELENA Millman, MD;  Location: [facility] OR;  Service: Orthopedics;  Laterality: Right;    OB History   No obstetric history on file.      Home Medications    Prior to  Admission medications   Medication Sig Start Date End Date Taking? Authorizing Provider  nitrofurantoin , macrocrystal-monohydrate, (MACROBID ) 100 MG capsule Take 1 capsule (100 mg total) by mouth 2 (two) times daily. 05/05/24  Yes Iola Lukes, FNP  phenazopyridine  (PYRIDIUM ) 200 MG tablet Take 1 tablet (200 mg total) by mouth 3 (three) times daily at 8am, 3pm and bedtime for 2 days. 05/05/24 05/07/24 Yes Janene Yousuf, FNP  acetaminophen  (TYLENOL ) 500 MG tablet Take 1,000 mg by mouth every 6 (six) hours as needed for mild pain or headache.     [provider]  albuterol (PROAIR HFA) 108 (90 Base) MCG/ACT inhaler Inhale into the lungs every 6 (six) hours as needed for wheezing or shortness of breath.    [provider]  apixaban  (ELIQUIS ) 5 MG TABS tablet Take 5 mg by mouth 2 (two) times daily.    [provider]  atorvastatin  (LIPITOR) 40 MG tablet Take 1 tablet (40 mg total) by mouth daily. 09/23/19   Ladona Heinz, MD  bisoprolol (ZEBETA) 10 MG tablet Take 10 mg by mouth 2 (two) times daily.    [provider]  cetirizine (ZYRTEC) 10 MG tablet Take 10 mg by mouth daily.    [provider]  clindamycin  (CLEOCIN ) 300 MG capsule TAKE 1 CAPSULE BY MOUTH AS NEEDED (1 HOUR BEFORE DENTAL WORK ENDOCARDITIS PROPHYLAXIS) 01/24/20   Ladona Heinz, MD  docusate sodium  100 MG CAPS 1 tab 2 times a day while on narcotics.  STOOL SOFTENER 03/11/14   Shepperson, Kirstin, PA-C  Empagliflozin-metFORMIN HCl 12.5-500 MG TABS Take by mouth 2 (two) times a day.    [provider]  magnesium 30 MG tablet Take 30 mg by mouth daily.    [provider]  Multiple Vitamin (MULTIVITAMIN WITH MINERALS) TABS Take 1 tablet by mouth See admin instructions. Take 1 tablet Monday - Friday    [provider]  omeprazole (PRILOSEC) 20 MG capsule Take 20 mg by mouth 2 (two) times daily.    [provider]  PARoxetine  (PAXIL ) 20 MG tablet Take 20 mg by mouth  daily.    [provider]  potassium gluconate 595 MG TABS tablet Take 595 mg by mouth daily.    [provider]  rivaroxaban  (XARELTO ) 10 MG TABS tablet Take 10 mg by mouth in the morning and at bedtime.    [provider]  Semaglutide 7 MG TABS Take by mouth daily.    [provider]  SUMAtriptan  (IMITREX ) 100 MG tablet Take 100 mg by mouth every 2 (two) hours as needed for migraine.     [provider]    Family History Family History  Problem Relation Age of Onset   Diabetes Mother    Heart disease Mother        murmur and cardiomyopathy   Heart disease Father    Arthritis-Osteo Father  Social History Social History   Tobacco Use   Smoking status: Never   Smokeless tobacco: Never  Vaping Use   Vaping status: Never Used  Substance Use Topics   Alcohol use: Yes    Comment: 1 drink a week   Drug use: No     Allergies   Amoxicillin-pot clavulanate and Tape   Review of Systems Review of Systems  Constitutional:  Negative for fever.  Gastrointestinal:  Negative for abdominal pain, nausea and vomiting.  Genitourinary:  Positive for dysuria, frequency and urgency. Negative for flank pain.  Musculoskeletal:  Positive for back pain.  All other systems reviewed and are negative.    Physical Exam Triage Vital Signs ED Triage Vitals  Encounter Vitals Group     BP 05/05/24 1319 118/61     Girls Systolic BP Percentile --      Girls Diastolic BP Percentile --      Boys Systolic BP Percentile --      Boys Diastolic BP Percentile --      Pulse Rate 05/05/24 1319 73     Resp 05/05/24 1319 16     Temp 05/05/24 1319 98.5 F (36.9 C)     Temp Source 05/05/24 1319 Oral     SpO2 05/05/24 1319 95 %     Weight --      Height --      Head Circumference --      Peak Flow --      Pain Score 05/05/24 1321 2     Pain Loc --      Pain Education --      Exclude from Growth Chart --    No data found.  Updated Vital Signs BP  118/61 (BP Location: Right Arm)   Pulse 73   Temp 98.5 F (36.9 C) (Oral)   Resp 16   SpO2 95%   Visual Acuity Right Eye Distance:   Left Eye Distance:   Bilateral Distance:    Right Eye Near:   Left Eye Near:    Bilateral Near:     Physical Exam Vitals reviewed.  Constitutional:      General: She is awake. She is not in acute distress.    Appearance: Normal appearance. She is well-developed. She is not ill-appearing, toxic-appearing or diaphoretic.  HENT:     Head: Normocephalic.     Right Ear: Hearing normal.     Left Ear: Hearing normal.     Nose: Nose normal.     Mouth/Throat:     Mouth: Mucous membranes are moist.  Eyes:     General: Vision grossly intact.     Conjunctiva/sclera: Conjunctivae normal.  Cardiovascular:     Rate and Rhythm: Normal rate and regular rhythm.     Heart sounds: Normal heart sounds.  Pulmonary:     Effort: Pulmonary effort is normal.     Breath sounds: Normal breath sounds and air entry.  Abdominal:     Palpations: Abdomen is soft.     Tenderness: There is no right CVA tenderness or left CVA tenderness.  Musculoskeletal:        General: Normal range of motion.     Cervical back: Full passive range of motion without pain, normal range of motion and neck supple.  Skin:    General: Skin is warm and dry.  Neurological:     General: No focal deficit present.     Mental Status: She is alert and oriented to person, place, and time.  Psychiatric:        Speech: Speech normal.        Behavior: Behavior is cooperative.      UC Treatments / Results  Labs (all labs ordered are listed, but only abnormal results are displayed) Labs Reviewed  POCT URINE DIPSTICK - Abnormal; Notable for the following components:      Result Value   Clarity, UA cloudy (*)    Glucose, UA >=1,000 (*)    Blood, UA small (*)    POC PROTEIN,UA =30 (*)    Leukocytes, UA Trace (*)    All other components within normal limits  URINE CULTURE     EKG   Radiology No results found.  Procedures Procedures (including critical care time)  Medications Ordered in UC Medications - No data to display  Initial Impression / Assessment and Plan / UC Course  I have reviewed the triage vital signs and the nursing notes.  Pertinent labs & imaging results that were available during my care of the patient were reviewed by me and considered in my medical decision making (see chart for details).     Patient presents with symptoms consistent with a urinary tract infection. Urinalysis reveals microscopic hematuria and  trace leukocytes, supporting the diagnosis. Nitrofurantoin  was prescribed to be taken twice daily for 5 days. Pyridium  was prescribed for urinary discomfort, to be taken three times daily for 2 days, with counseling that it may turn the urine orange. A urine culture was sent to identify the causative organism and assess antibiotic sensitivity. Patient was advised that they will be contacted only if the culture results require a change in treatment; otherwise, results can be reviewed via MyChart. Patient instructed to increase fluid intake and monitor symptoms. Follow up with primary care provider if symptoms do not improve or worsen. Emergency evaluation is warranted for fever, back or flank pain, nausea, vomiting, or signs of systemic illness.  Today's evaluation has revealed no signs of a dangerous process. Discussed diagnosis with patient and/or guardian. Patient and/or guardian aware of their diagnosis, possible red flag symptoms to watch out for and need for close follow up. Patient and/or guardian understands verbal and written discharge instructions. Patient and/or guardian comfortable with plan and disposition.  Patient and/or guardian has a clear mental status at this time, good insight into illness (after discussion and teaching) and has clear judgment to make decisions regarding their care  Documentation was completed with  the aid of voice recognition software. Transcription may contain typographical errors. Final Clinical Impressions(s) / UC Diagnoses   Final diagnoses:  Acute cystitis with hematuria     Discharge Instructions      You were seen today for symptoms consistent with a urinary tract infection (UTI). You have been prescribed Macrobid  to treat the infection and Pyridium  to help relieve discomfort such as burning, urgency, and bladder pressure. Take the antibiotics exactly as prescribed and complete the full course, even if you start feeling better. Pyridium   may cause your urine to change color, which is a normal side effect of the medication. A urine culture has been sent to identify the specific bacteria causing the infection and to confirm that the prescribed antibiotic is appropriate. You will only be contacted if your results are abnormal; otherwise, you may review them in your MyChart account.   It is important to stay well hydrated by drinking plenty of fluids throughout the day. This helps flush out your urinary system and keeps your urine light yellow, which  is a sign of good hydration. Avoid caffeine and alcohol, as they can irritate the bladder. Be sure to urinate regularly and empty your bladder fully. Do not hold your urine for extended periods. Always wipe from front to back after using the bathroom and use a clean tissue for each wipe. It is also important to urinate after sexual activity. Avoid douching or using sprays or powders in the genital area, as these can cause irritation. Follow up with your healthcare provider if your symptoms do not improve within a few days, get worse, or return after completing your treatment.        ED Prescriptions     Medication Sig Dispense Auth. Provider   nitrofurantoin , macrocrystal-monohydrate, (MACROBID ) 100 MG capsule Take 1 capsule (100 mg total) by mouth 2 (two) times daily. 10 capsule Iola Lukes, FNP   phenazopyridine  (PYRIDIUM ) 200  MG tablet Take 1 tablet (200 mg total) by mouth 3 (three) times daily at 8am, 3pm and bedtime for 2 days. 6 tablet Iola Lukes, FNP      PDMP not reviewed this encounter.   Iola Lukes, [place] 05/05/24 [phone]

## 2024-05-06 LAB — URINE CULTURE

## 2024-05-10 ENCOUNTER — Ambulatory Visit (HOSPITAL_COMMUNITY): Payer: Self-pay

## 2024-07-17 NOTE — Progress Notes (Signed)
 Surgical Instructions   Your procedure is scheduled on July 30, 2024. Report to [facility] Main Entrance A at 10:15 A.M., then check in with the Admitting office. Any questions or running late day of surgery: call [phone]  Questions prior to your surgery date: call [phone], Monday-Friday, 8am-4pm. If you experience any cold or flu symptoms such as cough, fever, chills, shortness of breath, etc. between now and your scheduled surgery, please notify us  at the above number.     Remember:  Do not eat after midnight the night before your surgery  You may drink clear liquids until 9:15 the morning of your surgery.   Clear liquids allowed are: Water , Non-Citrus Juices (without pulp), Carbonated Beverages, Clear Tea (no milk, honey, etc.), Black Coffee Only (NO MILK, CREAM OR POWDERED CREAMER of any kind), and Gatorade.    Take these medicines the morning of surgery with A SIP OF WATER   acetaminophen  (TYLENOL )  atorvastatin  (LIPITOR)  bisoprolol (ZEBETA)  cetirizine (ZYRTEC)  omeprazole (PRILOSEC)  PARoxetine  (PAXIL  )  May take these medicines IF NEEDED: SUMAtriptan  (IMITREX )   WHAT DO I DO ABOUT MY DIABETES MEDICATION?   Do not take oral diabetes medicines Empagliflozin-metFORMIN, Semaglutide  the morning of surgery.  Empagliflozin-metFORMIN Last dose    07-26-24   The day of surgery, do not take other diabetes injectables, including Byetta (exenatide), Bydureon (exenatide ER), Victoza (liraglutide), or Trulicity (dulaglutide).  If your CBG is greater than 220 mg/dL, you may take  of your sliding scale (correction) dose of insulin.   HOW TO MANAGE YOUR DIABETES BEFORE AND AFTER SURGERY  Why is it important to control my blood sugar before and after surgery? Improving blood sugar levels before and after surgery helps healing and can limit problems. A way of improving blood sugar control is eating a healthy diet by:  Eating less sugar and carbohydrates   Increasing activity/exercise  Talking with your doctor about reaching your blood sugar goals High blood sugars (greater than 180 mg/dL) can raise your risk of infections and slow your recovery, so you will need to focus on controlling your diabetes during the weeks before surgery. Make sure that the doctor who takes care of your diabetes knows about your planned surgery including the date and location.  How do I manage my blood sugar before surgery? Check your blood sugar at least 4 times a day, starting 2 days before surgery, to make sure that the level is not too high or low.  Check your blood sugar the morning of your surgery when you wake up and every 2 hours until you get to the Short Stay unit.  If your blood sugar is less than 70 mg/dL, you will need to treat for low blood sugar: Do not take insulin. Treat a low blood sugar (less than 70 mg/dL) with  cup of clear juice (cranberry or apple), 4 glucose tablets, OR glucose gel. Recheck blood sugar in 15 minutes after treatment (to make sure it is greater than 70 mg/dL). If your blood sugar is not greater than 70 mg/dL on recheck, call [phone] for further instructions. Report your blood sugar to the short stay nurse when you get to Short Stay.  If you are admitted to the hospital after surgery: Your blood sugar will be checked by the staff and you will probably be given insulin after surgery (instead of oral diabetes medicines) to make sure you have good blood sugar levels. The goal for blood sugar control after surgery is 80-180 mg/dL.  rivaroxaban  (XARELTO ) Last dose 07-23-24  One week prior to surgery, STOP taking any Aspirin  (unless otherwise instructed by your surgeon) Aleve, Naproxen, Ibuprofen , Motrin , Advil , Goody's, BC's, all herbal medications, fish oil, and non-prescription vitamins.                     Do NOT Smoke (Tobacco/Vaping) for 24 hours prior to your procedure.  If you use a CPAP at night, you may bring your  mask/headgear for your overnight stay.   You will be asked to remove any contacts, glasses, piercing's, hearing aid's, dentures/partials prior to surgery. Please bring cases for these items if needed.    Patients discharged the day of surgery will not be allowed to drive home, and someone needs to stay with them for 24 hours.  SURGICAL WAITING ROOM VISITATION Patients may have no more than 2 support people in the waiting area - these visitors may rotate.   Pre-op nurse will coordinate an appropriate time for 1 ADULT support person, who may not rotate, to accompany patient in pre-op.  Children under the age of 23 must have an adult with them who is not the patient and must remain in the main waiting area with an adult.  If the patient needs to stay at the hospital during part of their recovery, the visitor guidelines for inpatient rooms apply.  Please refer to the [facility] website for the visitor guidelines for any additional information.   If you received a COVID test during your pre-op visit  it is requested that you wear a mask when out in public, stay away from anyone that may not be feeling well and notify your surgeon if you develop symptoms. If you have been in contact with anyone that has tested positive in the last 10 days please notify you surgeon.      Pre-operative 4 CHG Bathing Instructions   You can play a key role in reducing the risk of infection after surgery. Your skin needs to be as free of germs as possible. You can reduce the number of germs on your skin by washing with CHG (chlorhexidine  gluconate) soap before surgery. CHG is an antiseptic soap that kills germs and continues to kill germs even after washing.   DO NOT use if you have an allergy to chlorhexidine /CHG or antibacterial soaps. If your skin becomes reddened or irritated, stop using the CHG and notify one of our RNs at [phone].   Please shower with the CHG soap starting 4 days before surgery using the  following schedule:     Please keep in mind the following:  DO NOT shave, including legs and underarms, starting the day of your first shower.   You may shave your face at any point before/day of surgery.  Place clean sheets on your bed the day you start using CHG soap. Use a clean washcloth (not used since being washed) for each shower. DO NOT sleep with pets once you start using the CHG.   CHG Shower Instructions:  Wash your face and private area with normal soap. If you choose to wash your hair, wash first with your normal shampoo.  After you use shampoo/soap, rinse your hair and body thoroughly to remove shampoo/soap residue.  Turn the water  OFF and apply  bottle of CHG soap to a CLEAN washcloth.  Apply CHG soap ONLY FROM YOUR NECK DOWN TO YOUR TOES (washing for 3-5 minutes)  DO NOT use CHG soap on face, private areas, open wounds,  or sores.  Pay special attention to the area where your surgery is being performed.  If you are having back surgery, having someone wash your back for you may be helpful. Wait 2 minutes after CHG soap is applied, then you may rinse off the CHG soap.  Pat dry with a clean towel  Put on clean clothes/pajamas   If you choose to wear lotion, please use ONLY the CHG-compatible lotions that are listed below.  Additional instructions for the day of surgery:  If you choose, you may shower the morning of surgery with an antibacterial soap.  DO NOT APPLY any lotions, deodorants, cologne, or perfumes.   Do not bring valuables to the hospital. [facility] is not responsible for any belongings/valuables. Do not wear nail polish, gel polish, artificial nails, or any other type of covering on natural nails (fingers and toes) Do not wear jewelry or makeup Put on clean/comfortable clothes.  Please brush your teeth.  Ask your nurse before applying any prescription medications to the skin.     CHG Compatible Lotions   Aveeno Moisturizing lotion  Cetaphil  Moisturizing Cream  Cetaphil Moisturizing Lotion  Clairol Herbal Essence Moisturizing Lotion, Dry Skin  Clairol Herbal Essence Moisturizing Lotion, Extra Dry Skin  Clairol Herbal Essence Moisturizing Lotion, Normal Skin  Curel Age Defying Therapeutic Moisturizing Lotion with Alpha Hydroxy  Curel Extreme Care Body Lotion  Curel Soothing Hands Moisturizing Hand Lotion  Curel Therapeutic Moisturizing Cream, Fragrance-Free  Curel Therapeutic Moisturizing Lotion, Fragrance-Free  Curel Therapeutic Moisturizing Lotion, Original Formula  Eucerin Daily Replenishing Lotion  Eucerin Dry Skin Therapy Plus Alpha Hydroxy Crme  Eucerin Dry Skin Therapy Plus Alpha Hydroxy Lotion  Eucerin Original Crme  Eucerin Original Lotion  Eucerin Plus Crme Eucerin Plus Lotion  Eucerin TriLipid Replenishing Lotion  Keri Anti-Bacterial Hand Lotion  Keri Deep Conditioning Original Lotion Dry Skin Formula Softly Scented  Keri Deep Conditioning Original Lotion, Fragrance Free Sensitive Skin Formula  Keri Lotion Fast Absorbing Fragrance Free Sensitive Skin Formula  Keri Lotion Fast Absorbing Softly Scented Dry Skin Formula  Keri Original Lotion  Keri Skin Renewal Lotion Keri Silky Smooth Lotion  Keri Silky Smooth Sensitive Skin Lotion  Nivea Body Creamy Conditioning Oil  Nivea Body Extra Enriched Lotion  Nivea Body Original Lotion  Nivea Body Sheer Moisturizing Lotion Nivea Crme  Nivea Skin Firming Lotion  NutraDerm 30 Skin Lotion  NutraDerm Skin Lotion  NutraDerm Therapeutic Skin Cream  NutraDerm Therapeutic Skin Lotion  ProShield Protective Hand Cream  Provon moisturizing lotion  Please read over the following fact sheets that you were given.

## 2024-07-18 ENCOUNTER — Encounter (HOSPITAL_COMMUNITY)
Admission: RE | Admit: 2024-07-18 | Discharge: 2024-07-18 | Disposition: A | Discharge status: Home / Self Care | Source: Ambulatory Visit | Attending: Orthopedic Surgery | Admitting: Orthopedic Surgery

## 2024-07-18 ENCOUNTER — Encounter (HOSPITAL_COMMUNITY): Payer: Self-pay

## 2024-07-18 ENCOUNTER — Other Ambulatory Visit: Payer: Self-pay

## 2024-07-18 VITALS — BP 116/66 | HR 83 | Temp 98.1°F | Resp 17 | Ht 60.0 in | Wt 150.7 lb

## 2024-07-18 DIAGNOSIS — I421 Obstructive hypertrophic cardiomyopathy: Secondary | ICD-10-CM | POA: Insufficient documentation

## 2024-07-18 DIAGNOSIS — Z01812 Encounter for preprocedural laboratory examination: Secondary | ICD-10-CM | POA: Diagnosis present

## 2024-07-18 DIAGNOSIS — Z01818 Encounter for other preprocedural examination: Secondary | ICD-10-CM

## 2024-07-18 DIAGNOSIS — I499 Cardiac arrhythmia, unspecified: Secondary | ICD-10-CM | POA: Insufficient documentation

## 2024-07-18 DIAGNOSIS — Z95810 Presence of automatic (implantable) cardiac defibrillator: Secondary | ICD-10-CM | POA: Diagnosis not present

## 2024-07-18 DIAGNOSIS — M1611 Unilateral primary osteoarthritis, right hip: Secondary | ICD-10-CM | POA: Diagnosis not present

## 2024-07-18 DIAGNOSIS — E119 Type 2 diabetes mellitus without complications: Secondary | ICD-10-CM | POA: Insufficient documentation

## 2024-07-18 HISTORY — DX: Personal history of other diseases of the digestive system: Z87.19

## 2024-07-18 HISTORY — DX: Type 2 diabetes mellitus without complications: E11.9

## 2024-07-18 HISTORY — DX: Presence of automatic (implantable) cardiac defibrillator: Z95.810

## 2024-07-18 HISTORY — DX: Presence of cardiac pacemaker: Z95.0

## 2024-07-18 HISTORY — DX: Dyspnea, unspecified: R06.00

## 2024-07-18 LAB — HEMOGLOBIN A1C
Hgb A1c MFr Bld: 6 % — ABNORMAL HIGH (ref 4.8–5.6)
Mean Plasma Glucose: 125.5 mg/dL

## 2024-07-18 LAB — SURGICAL PCR SCREEN
MRSA, PCR: NEGATIVE
Staphylococcus aureus: NEGATIVE

## 2024-07-18 LAB — TYPE AND SCREEN
ABO/RH(D): O POS
Antibody Screen: NEGATIVE

## 2024-07-18 LAB — CBC
HCT: 42.3 % (ref 36.0–46.0)
Hemoglobin: 12.4 g/dL (ref 12.0–15.0)
MCH: 25.2 pg — ABNORMAL LOW (ref 26.0–34.0)
MCHC: 29.3 g/dL — ABNORMAL LOW (ref 30.0–36.0)
MCV: 85.8 fL (ref 80.0–100.0)
Platelets: 293 K/uL (ref 150–400)
RBC: 4.93 MIL/uL (ref 3.87–5.11)
RDW: 17 % — ABNORMAL HIGH (ref 11.5–15.5)
WBC: 8.3 K/uL (ref 4.0–10.5)
nRBC: 0 % (ref 0.0–0.2)

## 2024-07-18 LAB — BASIC METABOLIC PANEL WITH GFR
Anion gap: 10 (ref 5–15)
BUN: 8 mg/dL (ref 8–23)
CO2: 25 mmol/L (ref 22–32)
Calcium: 9.1 mg/dL (ref 8.9–10.3)
Chloride: 105 mmol/L (ref 98–111)
Creatinine, Ser: 0.59 mg/dL (ref 0.44–1.00)
GFR, Estimated: >60 mL/min (ref >60)
Glucose, Bld: 95 mg/dL (ref 70–99)
Potassium: 4.4 mmol/L (ref 3.5–5.1)
Sodium: 140 mmol/L (ref 135–145)

## 2024-07-18 LAB — GLUCOSE, CAPILLARY: Glucose-Capillary: 136 mg/dL — ABNORMAL HIGH (ref 70–99)

## 2024-07-18 NOTE — Progress Notes (Addendum)
 Surgical Instructions   Your procedure is scheduled on July 30, 2024. Report to [facility] Main Entrance A at 10:15 A.M., then check in with the Admitting office. Any questions or running late day of surgery: call [phone]  Questions prior to your surgery date: call [phone], Monday-Friday, 8am-4pm. If you experience any cold or flu symptoms such as cough, fever, chills, shortness of breath, etc. between now and your scheduled surgery, please notify us  at the above number.     Remember:  Do not eat after midnight the night before your surgery  You may drink clear liquids until 9:15 the morning of your surgery.   Clear liquids allowed are: Water , Non-Citrus Juices (without pulp), Carbonated Beverages, Clear Tea (no milk, honey, etc.), Black Coffee Only (NO MILK, CREAM OR POWDERED CREAMER of any kind), and Gatorade.  Patient Instructions  The night before surgery:  No food after midnight. ONLY clear liquids after midnight   The day of surgery (if you have diabetes): Drink ONE (1) 12 oz G2 given to you in your pre admission testing appointment by 9:15am the morning of surgery. Drink in one sitting. Do not sip.  This drink was given to you during your hospital  pre-op appointment visit.  Nothing else to drink after completing the  12 oz bottle of G2.         If you have questions, please contact your surgeon's office.     Take these medicines the morning of surgery with A SIP OF WATER :  acetaminophen  (TYLENOL )  atorvastatin  (LIPITOR)  bisoprolol (ZEBETA)  cetirizine (ZYRTEC)  omeprazole (PRILOSEC)  PARoxetine  (PAXIL  )  May take these medicines IF NEEDED: SUMAtriptan  (IMITREX )   Per your Doctor, STOP your Rivaroxaban  (XARELTO ) 5 days prior to surgery. Your Last Dose should be on November 26.   One week prior to surgery, STOP taking any Aspirin  (unless otherwise instructed by your surgeon) Aleve, Naproxen, Ibuprofen , Motrin , Advil , Goody's, BC's, all herbal  medications, fish oil, and non-prescription vitamins.   WHAT DO I DO ABOUT MY DIABETES MEDICATION?   Per your Doctor, HOLD your Oral Semaglutide 1 week.  You need to HOLD your Empagliflozin-metFORMIN 3 days prior to surgery. Your Last Dose should be on November 28.  Do not take oral diabetes medicines the morning of surgery. NO Empagliflozin-metFORMIN or Oral Semaglutide the morning of surgery.    HOW TO MANAGE YOUR DIABETES BEFORE AND AFTER SURGERY  Why is it important to control my blood sugar before and after surgery? Improving blood sugar levels before and after surgery helps healing and can limit problems. A way of improving blood sugar control is eating a healthy diet by:  Eating less sugar and carbohydrates  Increasing activity/exercise  Talking with your doctor about reaching your blood sugar goals High blood sugars (greater than 180 mg/dL) can raise your risk of infections and slow your recovery, so you will need to focus on controlling your diabetes during the weeks before surgery. Make sure that the doctor who takes care of your diabetes knows about your planned surgery including the date and location.  How do I manage my blood sugar before surgery? Check your blood sugar at least 4 times a day, starting 2 days before surgery, to make sure that the level is not too high or low.  Check your blood sugar the morning of your surgery when you wake up and every 2 hours until you get to the Short Stay unit.  If your blood sugar is less than  70 mg/dL, you will need to treat for low blood sugar: Do not take insulin. Treat a low blood sugar (less than 70 mg/dL) with  cup of clear juice (cranberry or apple), 4 glucose tablets, OR glucose gel. Recheck blood sugar in 15 minutes after treatment (to make sure it is greater than 70 mg/dL). If your blood sugar is not greater than 70 mg/dL on recheck, call [phone] for further instructions. Report your blood sugar to the short stay  nurse when you get to Short Stay.  If you are admitted to the hospital after surgery: Your blood sugar will be checked by the staff and you will probably be given insulin after surgery (instead of oral diabetes medicines) to make sure you have good blood sugar levels. The goal for blood sugar control after surgery is 80-180 mg/dL.                       Do NOT Smoke (Tobacco/Vaping) for 24 hours prior to your procedure.  If you use a CPAP at night, you may bring your mask/headgear for your overnight stay.   You will be asked to remove any contacts, glasses, piercing's, hearing aid's, dentures/partials prior to surgery. Please bring cases for these items if needed.    Patients discharged the day of surgery will not be allowed to drive home, and someone needs to stay with them for 24 hours.  SURGICAL WAITING ROOM VISITATION Patients may have no more than 2 support people in the waiting area - these visitors may rotate.   Pre-op nurse will coordinate an appropriate time for 1 ADULT support person, who may not rotate, to accompany patient in pre-op.  Children under the age of 49 must have an adult with them who is not the patient and must remain in the main waiting area with an adult.  If the patient needs to stay at the hospital during part of their recovery, the visitor guidelines for inpatient rooms apply.  Please refer to the [facility] website for the visitor guidelines for any additional information.   If you received a COVID test during your pre-op visit  it is requested that you wear a mask when out in public, stay away from anyone that may not be feeling well and notify your surgeon if you develop symptoms. If you have been in contact with anyone that has tested positive in the last 10 days please notify you surgeon.      Pre-operative 4 CHG Bathing Instructions   You can play a key role in reducing the risk of infection after surgery. Your skin needs to be as free of germs as  possible. You can reduce the number of germs on your skin by washing with CHG (chlorhexidine  gluconate) soap before surgery. CHG is an antiseptic soap that kills germs and continues to kill germs even after washing.   DO NOT use if you have an allergy to chlorhexidine /CHG or antibacterial soaps. If your skin becomes reddened or irritated, stop using the CHG and notify one of our RNs at [phone].   Please shower with the CHG soap starting 4 days before surgery using the following schedule:     Please keep in mind the following:  DO NOT shave, including legs and underarms, starting the day of your first shower.   You may shave your face at any point before/day of surgery.  Place clean sheets on your bed the day you start using CHG soap. Use a clean  washcloth (not used since being washed) for each shower. DO NOT sleep with pets once you start using the CHG.   CHG Shower Instructions:  Wash your face and private area with normal soap. If you choose to wash your hair, wash first with your normal shampoo.  After you use shampoo/soap, rinse your hair and body thoroughly to remove shampoo/soap residue.  Turn the water  OFF and apply  bottle of CHG soap to a CLEAN washcloth.  Apply CHG soap ONLY FROM YOUR NECK DOWN TO YOUR TOES (washing for 3-5 minutes)  DO NOT use CHG soap on face, private areas, open wounds, or sores.  Pay special attention to the area where your surgery is being performed.  If you are having back surgery, having someone wash your back for you may be helpful. Wait 2 minutes after CHG soap is applied, then you may rinse off the CHG soap.  Pat dry with a clean towel  Put on clean clothes/pajamas   If you choose to wear lotion, please use ONLY the CHG-compatible lotions that are listed below.  Additional instructions for the day of surgery:  If you choose, you may shower the morning of surgery with an antibacterial soap.  DO NOT APPLY any lotions, deodorants, cologne, or  perfumes.   Do not bring valuables to the hospital. [facility] is not responsible for any belongings/valuables. Do not wear nail polish, gel polish, artificial nails, or any other type of covering on natural nails (fingers and toes) Do not wear jewelry or makeup Put on clean/comfortable clothes.  Please brush your teeth.  Ask your nurse before applying any prescription medications to the skin.     CHG Compatible Lotions   Aveeno Moisturizing lotion  Cetaphil Moisturizing Cream  Cetaphil Moisturizing Lotion  Clairol Herbal Essence Moisturizing Lotion, Dry Skin  Clairol Herbal Essence Moisturizing Lotion, Extra Dry Skin  Clairol Herbal Essence Moisturizing Lotion, Normal Skin  Curel Age Defying Therapeutic Moisturizing Lotion with Alpha Hydroxy  Curel Extreme Care Body Lotion  Curel Soothing Hands Moisturizing Hand Lotion  Curel Therapeutic Moisturizing Cream, Fragrance-Free  Curel Therapeutic Moisturizing Lotion, Fragrance-Free  Curel Therapeutic Moisturizing Lotion, Original Formula  Eucerin Daily Replenishing Lotion  Eucerin Dry Skin Therapy Plus Alpha Hydroxy Crme  Eucerin Dry Skin Therapy Plus Alpha Hydroxy Lotion  Eucerin Original Crme  Eucerin Original Lotion  Eucerin Plus Crme Eucerin Plus Lotion  Eucerin TriLipid Replenishing Lotion  Keri Anti-Bacterial Hand Lotion  Keri Deep Conditioning Original Lotion Dry Skin Formula Softly Scented  Keri Deep Conditioning Original Lotion, Fragrance Free Sensitive Skin Formula  Keri Lotion Fast Absorbing Fragrance Free Sensitive Skin Formula  Keri Lotion Fast Absorbing Softly Scented Dry Skin Formula  Keri Original Lotion  Keri Skin Renewal Lotion Keri Silky Smooth Lotion  Keri Silky Smooth Sensitive Skin Lotion  Nivea Body Creamy Conditioning Oil  Nivea Body Extra Enriched Lotion  Nivea Body Original Lotion  Nivea Body Sheer Moisturizing Lotion Nivea Crme  Nivea Skin Firming Lotion  NutraDerm 30 Skin Lotion  NutraDerm  Skin Lotion  NutraDerm Therapeutic Skin Cream  NutraDerm Therapeutic Skin Lotion  ProShield Protective Hand Cream  Provon moisturizing lotion  Please read over the following fact sheets that you were given.

## 2024-07-18 NOTE — Progress Notes (Signed)
 PCP - Dr. Zac Hall Cardiologist - Dr. Prentice Flatten EP: Dr. Dorn Scripture  PPM/ICD - ICD and Pacemaker Device Orders - faxed to Dr. Dorn Scripture Rep Notified - [facility]-- called and spoke with Joey. Aware of surgery date and time. Joey requested to be contacted with MD orders and device orders received back.  Chest x-ray -  EKG - 03/07/24-- release of information signed, EKG tracing requested from [facility] Stress Test - 03/23/2023 C.E. ECHO - 03/23/2023 C.E. Cardiac Cath - 07/16/2024 C.E.  Sleep Study - denies CPAP -   Fasting Blood Sugar - 90-100 Checks Blood Sugar does not check at home   GLP1 instructions: Told by PCP to hold ORAL Rybelsis 1 week  Blood Thinner Instructions: Xarelto  hold 5 days Aspirin  Instructions:  ERAS Protcol - clears until 9:15 PRE-SURGERY Ensure or G2- G2 provided  COVID TEST- n/a   Anesthesia review: YES, cardiac history  Patient denies shortness of breath, fever, cough and chest pain at PAT appointment

## 2024-07-19 ENCOUNTER — Encounter (HOSPITAL_COMMUNITY): Payer: Self-pay

## 2024-07-19 NOTE — Progress Notes (Signed)
 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 She reported prolonged emergence and hypotension with anesthesia.   Last [facility] cardiology visit with Dr. Prentice Webster was on 05/20/2024. He wrote, NYHA class II symptoms status post myectomy with no residual left ventricular outflow tract obstruction. She remains on beta-blocker therapy and SGLT2 inhibitor. Volume status and cardiac exam are normal today. No changes in medication made today. She has primary prevention ICD with occasional NSVT events but no device therapies.  TTE in one year planned.   She had EP office visit on 03/07/2024 with Heidi Crome, NP. She was doing well from an EP standpoint. No episodes of syncope or near-syncope, although she did continue to have episodes of NSVT. Afib burden was < 1%. She was on bisoprolol and Xarelto . One year office follow-up planned.   Per 05/29/2024 ICD remote interrogation: Remote interrogation of ICD Date of Implant: Feb 26, 2015 Programmed Mode: DDD Lower Rate: 65 bpm Presenting Rhythm: AS-VS  PVC  Device Functionality Device: Stable function Estimated Battery Longevity: 5 years Leads: Appear stable Atrial pacing: 3.0 % RV pacing: 1.0 %  Episodes 2 NSVT episodes suggest true NSVT vs SVT longest 6s @ 195 bpm. V-122 - Event Detail Report   No therapies delivered.  Cardiologist Dr. Flatten classified patient as low risk with recommendation to hold Xarelto  for 5 days prior to surgery. PCP Dr. Shona felt she was optimized from a Medical standpoint. Both forms are scanned under Media tab, AMB Correspondence Heidi Webster.,   PAT staff requested her July 2025 EKG and EP ICD Perioperative Device Recommendations from [facility] EP. Heidi Webster with [employer] was notified of surgery date and time.   A1c 6.0% on 07/18/2024. She is on empagliflozin-metformin, Rybelsus. She reported instructions by primary care to hold Rybelsus for one week prior to surgery.   Anesthesia team to evaluate on the day of surgery.   ADDENDUM 07/24/2024 3:27 PM10:10 AM: ICD Perioperative Device form received (scanned under Media tab) on 07/22/2024. Per form, she has a Environmental Manager ICD. Manufacturer phone number is [email or web address]. She is not pacer dependent. Last device check was 05/29/2024 with normal device function. Procedure may interfere with device function. Magnet should be placed over device during procedure. Post-operative interrogation is not needed. Provide continuous ECG monitoring when magnet is used or reprogramming is to be performed.   PAT nursing staff to update [employer] Rep. 03/07/2024 EKG tracing received from [facility] EP showing NSR, LAD, LBBB.    VS: BP 116/66   Pulse 83   Temp 36.7 C   Resp 17   Ht 5' (1.524 m)   Wt 68.4 kg   SpO2 97%   BMI 29.43 kg/m    PROVIDERS: Heidi Webster  Z, MD is PCP  Heidi Barter, MD is cardiologist Heidi Webster)  Heidi Carrier, MD is EP cardiologist Heidi Webster)   LABS: Labs reviewed: Acceptable for surgery. (all labs ordered are listed, but only abnormal results are  displayed)  Labs Reviewed  GLUCOSE, CAPILLARY - Abnormal; Notable for the following components:      Result Value   Glucose-Capillary 136 (*)    All other components within normal limits  HEMOGLOBIN A1C - Abnormal; Notable for the following components:   Hgb A1c MFr Bld 6.0 (*)    All other components within normal limits  CBC - Abnormal; Notable for the following components:   MCH 25.2 (*)    MCHC 29.3 (*)    RDW 17.0 (*)    All other components within normal limits  SURGICAL PCR SCREEN  BASIC METABOLIC PANEL WITH GFR  TYPE AND SCREEN    EKG: EKG 03/07/2024 ([facility]; scanned under Media tab): Tracing requested from [facility] EP. Per Narrative in CE:  Normal sinus rhythm  Left axis deviation  Left bundle branch block  Abnormal ECG  When compared with ECG of 08-Mar-2023 11:17,  Prolonged QT is now absent  I reviewed and concur with this report. Electronically signed ab:[email or web address], MD, Heidi Webster ([id number]) on 03/07/2024 11:16:14 PM    CV: Nuclear stress test 03/23/2023 ([facility] CE): Myocardial perfusion imaging is Abnormal- consistent with  partial-thickness infarction of the lateral wall.  No ischemia.  Summed severity score is  13 .  Artifacts noted:  none.  Overall left ventricular systolic function was Normal (EF 55%) with  abnormal motion of the septum, consistent with previous cardiac surgery.    Echo 03/23/2023 ([facility] CE): MILD LV DYSFUNCTION (septal hypocontractile) WITH MODERATE LVH. Closest EF: 50% (Estimated).  Calc.EF: 53% (3D).      NORMAL RIGHT VENTRICULAR SYSTOLIC FUNCTION  VALVULAR REGURGITATION: TRIVIAL MR, TRIVIAL TR  NO CAVITARY OR LVOT GRADIENT AT REST OR WITH VALSALVA.  S/P  SEPTAL MYECTOMY AND MITRAL PLICATION STITCH 07/11/2017.  - Compared with prior Echo study on 07/20/2021: NO SIGNIFICANT CHANGE    Left and Right Heart Catheterization (Pre-Myomectomy) 07/10/2017 ([facility], as outlined in H&P): Insignificant CAD.  Elevated filling pressures with mild pulmonary HTN.     Cardiac MRI (around initial diagnosis of HOCM) 10/09/2013: 1. Today's study demonstrates definitive evidence of hypertrophic obstructive cardiomyopathy (HOCM). Specifically, there is marked asymmetric septal hypertrophy, evidence of infiltrative cardiomyopathy on delayed enhancement imaging, dynamic outflow tract obstruction which is severely worsened by [facility] resulting in a peak systolic gradient of greater than 121 mmHg in the left ventricular outflow tract as well as moderate mitral regurgitation.  2. Normal resting left ventricular wall motion and function with calculated ejection fraction of 60% percent.  3. Mild left atrial dilatation.    Past Medical History:  Diagnosis Date   Abnormal EKG    AICD (automatic cardioverter/defibrillator) present 02/26/2015   [facility]   Anxiety    Atrial fibrillation (HCC)    Complication of anesthesia    slow to wake up, bp drops   Diabetes mellitus without complication (HCC)    Diverticula of colon 2009   Dyspnea    Dysrhythmia    tachycardia   Family history of anesthesia complication    son severe nausea and vomiting   GERD (gastroesophageal reflux disease)    occ    Heart murmur    resolved 07/11/2017 post cardiac surgery per pt   History of hiatal hernia    History of kidney stones  History of kidney stones    Hx MRSA infection 2009   Hyperlipidemia    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, s/p ventricular septal myomectomy with Alfieri stitch for repair of MV 07/11/2017   Left knee DJD    Migraines    NSVT (nonsustained ventricular tachycardia) (HCC) 2016   Presence of permanent cardiac pacemaker    Primary localized osteoarthritis of right knee    SBE (subacute bacterial endocarditis)     Past Surgical History:  Procedure Laterality Date   BI-VENTRICULAR IMPLANTABLE CARDIOVERTER DEFIBRILLATOR  (CRT-D)  02/26/2015   CARDIAC CATHETERIZATION  08/2013   06/2017    COLON SURGERY  2009   perforated Diverticula   INSERT / REPLACE / REMOVE PACEMAKER     KNEE ARTHROSCOPY Left 03/10/2014   Procedure: ARTHROSCOPY KNEE;  Surgeon: Lamar DELENA Millman, MD;  [facility];  Service: Orthopedics;  Laterality: Left;   LEFT HEART CATHETERIZATION WITH CORONARY ANGIOGRAM N/A 09/03/2013   Procedure: LEFT HEART CATHETERIZATION WITH CORONARY ANGIOGRAM;  Surgeon: Erick JONELLE Bergamo, MD;  Location: [facility] CATH LAB;  Service: Cardiovascular;  Laterality: N/A;   MITRAL VALVE REPAIR  2018   SHOULDER CAPSULORRHAPHY Right 1991   TOTAL KNEE ARTHROPLASTY Left 03/10/2014   Procedure: TOTAL KNEE ARTHROPLASTY;  Surgeon: Lamar DELENA Millman, MD;  [facility];  Service: Orthopedics;  Laterality: Left;   TOTAL KNEE ARTHROPLASTY Right 07/21/2014   Procedure: RIGHT TOTAL KNEE ARTHROPLASTY;  Surgeon: Lamar DELENA Millman, MD;  [facility];  Service: Orthopedics;  Laterality: Right;   VENTRICULOMYOTOMY (-MYECTOMY) FOR IDIOPATHIC HYPERTROPHIC SUBAORTIC STENOSIS (EG, ASYMMETRIC SEPTAL HYPERTROPHY)  06/2017    MEDICATIONS:  acetaminophen  (TYLENOL ) 500 MG tablet   atorvastatin  (LIPITOR) 40 MG tablet   bisoprolol (ZEBETA) 10 MG tablet   Camphor-Menthol -Methyl Sal (SALONPAS) 3.09-03-08 % PTCH   cetirizine (ZYRTEC) 10 MG tablet   clindamycin  (CLEOCIN ) 300 MG capsule   docusate sodium  100 MG CAPS   Empagliflozin-metFORMIN HCl 12.5-500 MG TABS   furosemide (LASIX) 20 MG tablet   magnesium 30 MG tablet   Multiple Vitamin (MULTIVITAMIN WITH MINERALS) TABS   mupirocin ointment (BACTROBAN) 2 %   nitrofurantoin , macrocrystal-monohydrate, (MACROBID ) 100 MG capsule   omeprazole (PRILOSEC) 20 MG capsule   PARoxetine  (PAXIL ) 20 MG tablet   potassium gluconate 595 MG TABS tablet   rivaroxaban  (XARELTO ) 20 MG TABS tablet   Semaglutide 14 MG TABS   SUMAtriptan  (IMITREX ) 100 MG tablet   No current facility-administered medications for this encounter.    Isaiah Ruder, PA-C Surgical Short  Stay/Anesthesiology [facility] Phone [phone] [facility] Phone [phone] 07/19/2024 5:59 PM

## 2024-07-19 NOTE — Anesthesia Preprocedure Evaluation (Addendum)
 Anesthesia Evaluation  Patient identified by MRN, date of birth, ID band Patient awake    Reviewed: Allergy & Precautions, H&P , NPO status , Patient's Chart, lab work & pertinent test results, Unable to perform ROS - Chart review only  History of Anesthesia Complications (+) Family history of anesthesia reactionNegative for: history of anesthetic complications  Airway Mallampati: II  TM Distance: >3 FB Neck ROM: Full    Dental  (+) Dental Advisory Given   Pulmonary neg pulmonary ROS, shortness of breath   Pulmonary exam normal breath sounds clear to auscultation       Cardiovascular Pt. on home beta blockers negative cardio ROS Normal cardiovascular exam+ dysrhythmias + pacemaker + Cardiac Defibrillator + Valvular Problems/Murmurs  Rhythm:Regular Rate:Normal + Systolic murmurs CV: Nuclear stress test 03/23/2023 ([facility]): Myocardial perfusion imaging is Abnormal- consistent with  partial-thickness infarction of the lateral wall.  No ischemia.  Summed severity score is  13 .  Artifacts noted:  none.  Overall left ventricular systolic function was Normal (EF 55%) with  abnormal motion of the septum, consistent with previous cardiac surgery.     Echo 03/23/2023 ([facility] CE): MILD LV DYSFUNCTION (septal hypocontractile) WITH MODERATE LVH. Closest EF: 50% (Estimated).  Calc.EF: 53% (3D).      NORMAL RIGHT VENTRICULAR SYSTOLIC FUNCTION  VALVULAR REGURGITATION: TRIVIAL MR, TRIVIAL TR  NO CAVITARY OR LVOT GRADIENT AT REST OR WITH VALSALVA.  S/P  SEPTAL MYECTOMY AND MITRAL PLICATION STITCH 07/11/2017.  - Compared with prior Echo study on 07/20/2021: NO SIGNIFICANT CHANGE      Left and Right Heart Catheterization (Pre-Myomectomy) 07/10/2017 ([facility], as outlined in H&P): Insignificant CAD.  Elevated filling pressures with mild pulmonary HTN.      Cardiac MRI (around initial diagnosis of HOCM) 10/09/2013: 1. 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 Specifically, there is marked asymmetric septal hypertrophy, evidence of infiltrative cardiomyopathy on delayed enhancement imaging, dynamic outflow tract obstruction which is severely worsened by [facility] resulting in a peak systolic gradient of greater than 121 mmHg in the left ventricular outflow tract as well as moderate mitral regurgitation.  2. Normal resting left ventricular wall motion and function with calculated ejection fraction of 60% percent.  3. Mild left atrial dilatation.     Neuro/Psych  Headaches, neg Seizures PSYCHIATRIC DISORDERS Anxiety     negative neurological ROS  negative psych ROS   GI/Hepatic negative GI ROS, Neg liver ROS, hiatal hernia,GERD  Medicated,,  Endo/Other  negative endocrine ROSdiabetes, Oral Hypoglycemic Agents  semaglutide  Renal/GU Renal diseasenegative Renal ROS  negative genitourinary   Musculoskeletal negative musculoskeletal ROS (+) Arthritis , Osteoarthritis,    Abdominal   Peds negative pediatric ROS (+)  Hematology negative hematology ROS (+) xarelto    Anesthesia Other Findings   Reproductive/Obstetrics negative OB ROS                              Anesthesia Physical Anesthesia Plan  ASA: 4  Anesthesia Plan: Spinal   Post-op Pain Management: Gabapentin PO (pre-op)*   Induction: Intravenous  PONV Risk Score and Plan: 2 and Propofol  infusion and Treatment may vary due to age or medical condition  Airway Management Planned: Simple Face Mask  Additional Equipment:   Intra-op Plan:   Post-operative Plan:   Informed Consent: I have reviewed the patients History and Physical, chart, labs and discussed the procedure including the risks, benefits and alternatives for the proposed anesthesia  with the patient or authorized representative who has indicated his/her understanding and acceptance.     Dental advisory given  Plan  Discussed with:   Anesthesia Plan Comments: (PAT note written 07/19/2024 by Allison Zelenak, PA-C. S/p septal myomectomy 2018 for HOCM. [employer] ICD 2016.  ICD Perioperative Device form received (scanned under Media tab). Per form, she has a Environmental Manager ICD. Manufacturer phone number is [phone]-[email or web address]. She is not pacer dependent. Last device check was 05/29/2024 with normal device function. Procedure may interfere with device function. Magnet should be placed over device during procedure. Post-operative interrogation is not needed. Provide continuous ECG monitoring when magnet is used or reprogramming is to be performed.  )         Anesthesia Quick Evaluation

## 2024-07-29 NOTE — H&P (Signed)
 HIP ARTHROPLASTY ADMISSION H&P  Patient ID: Heidi Webster MRN: [id number] DOB/AGE: 10-07-1960 63 y.o.  Chief Complaint: right hip pain.  Planned Procedure Date: 07/30/24 Medical Clearance by Dr. Shona   Cardiac Clearance by Dr. Cesario  HPI: Heidi Webster is a 63 y.o. female who presents for evaluation of osteoarthritis of one hip. The patient has a history of pain and functional disability in the right hip due to arthritis and has failed non-surgical conservative treatments for greater than 12 weeks to include NSAID's and/or analgesics, corticosteriod injections, and activity modification.  Onset of symptoms was gradual, starting 2 years ago with rapidlly worsening course since that time. The patient noted no past surgery on the right hip.  Patient currently rates pain at 9 out of 10 with activity. Patient has worsening of pain with activity and weight bearing and pain that interferes with activities of daily living.  Patient has evidence of joint space narrowing by imaging studies.  There is no active infection.  Past Medical History:  Diagnosis Date   Abnormal EKG    AICD (automatic cardioverter/defibrillator) present 02/26/2015   [facility] [employer]   Anxiety    Atrial fibrillation (HCC)    Complication of anesthesia    slow to wake up, bp drops   Diabetes mellitus without complication (HCC)    Diverticula of colon 2009   Dyspnea    Dysrhythmia    tachycardia   Family history of anesthesia complication    son severe nausea and vomiting   GERD (gastroesophageal reflux disease)    occ    Heart murmur    resolved 07/11/2017 post cardiac surgery per pt   History of hiatal hernia    History of kidney stones    History of kidney stones    Hx MRSA infection 2009   Hyperlipidemia    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, s/p ventricular septal myomectomy with Alfieri stitch for repair of MV 07/11/2017   Left knee DJD    Migraines     NSVT (nonsustained ventricular tachycardia) (HCC) 2016   Presence of permanent cardiac pacemaker    Primary localized osteoarthritis of right knee    SBE (subacute bacterial endocarditis)    Past Surgical History:  Procedure Laterality Date   BI-VENTRICULAR IMPLANTABLE CARDIOVERTER DEFIBRILLATOR  (CRT-D)  02/26/2015   CARDIAC CATHETERIZATION  08/2013   06/2017   COLON SURGERY  2009   perforated Diverticula   INSERT / REPLACE / REMOVE PACEMAKER     KNEE ARTHROSCOPY Left 03/10/2014   Procedure: ARTHROSCOPY KNEE;  Surgeon: Lamar DELENA Millman, MD;  [facility];  Service: Orthopedics;  Laterality: Left;   LEFT HEART CATHETERIZATION WITH CORONARY ANGIOGRAM N/A 09/03/2013   Procedure: LEFT HEART CATHETERIZATION WITH CORONARY ANGIOGRAM;  Surgeon: Erick JONELLE Bergamo, MD;  Location: [facility] CATH LAB;  Service: Cardiovascular;  Laterality: N/A;   MITRAL VALVE REPAIR  07/11/2017   s/p ventricular septal myomectomy with Alfieri stitch for repair of MV   SHOULDER CAPSULORRHAPHY Right 1991   TOTAL KNEE ARTHROPLASTY Left 03/10/2014   Procedure: TOTAL KNEE ARTHROPLASTY;  Surgeon: Lamar DELENA Millman, MD;  [facility];  Service: Orthopedics;  Laterality: Left;   TOTAL KNEE ARTHROPLASTY Right 07/21/2014   Procedure: RIGHT TOTAL KNEE ARTHROPLASTY;  Surgeon: Lamar DELENA Millman, MD;  [facility];  Service: Orthopedics;  Laterality: Right;   VENTRICULOMYOTOMY (-MYECTOMY) FOR IDIOPATHIC HYPERTROPHIC SUBAORTIC STENOSIS (EG, ASYMMETRIC SEPTAL HYPERTROPHY)  07/11/2017   Allergies  Allergen  Reactions   Amoxicillin-Pot Clavulanate Rash, Hives and Itching   Tape Rash    bandaides   Prior to Admission medications   Medication Sig Start Date End Date Taking? Authorizing Provider  acetaminophen  (TYLENOL ) 500 MG tablet Take 1,000 mg by mouth every 6 (six) hours as needed for mild pain or headache.    Yes [provider]  atorvastatin  (LIPITOR) 40 MG tablet Take 1 tablet (40 mg total) by mouth daily. 09/23/19   Yes Ladona Heinz, MD  bisoprolol (ZEBETA) 10 MG tablet Take 10 mg by mouth 2 (two) times daily.   Yes [provider]  Camphor-Menthol -Methyl Sal (SALONPAS) 3.09-03-08 % PTCH Place 1 patch onto the skin daily as needed (pain).   Yes [provider]  cetirizine (ZYRTEC) 10 MG tablet Take 10 mg by mouth daily.   Yes [provider]  docusate sodium  100 MG CAPS 1 tab 2 times a day while on narcotics.  STOOL SOFTENER Patient taking differently: Take 100 mg by mouth daily. 03/11/14  Yes Shepperson, Kirstin, PA-C  Empagliflozin-metFORMIN HCl 12.5-500 MG TABS Take 1 tablet by mouth 2 (two) times a day.   Yes [provider]  furosemide (LASIX) 20 MG tablet Take 10 mg by mouth daily as needed for fluid or edema.   Yes [provider]  magnesium 30 MG tablet Take 30 mg by mouth daily.   Yes [provider]  Multiple Vitamin (MULTIVITAMIN WITH MINERALS) TABS Take 1 tablet by mouth every Monday, Tuesday, Wednesday, Thursday, and Friday.   Yes [provider]  omeprazole (PRILOSEC) 20 MG capsule Take 20 mg by mouth 2 (two) times daily.   Yes [provider]  PARoxetine  (PAXIL ) 20 MG tablet Take 20 mg by mouth daily.   Yes [provider]  potassium gluconate 595 MG TABS tablet Take 595 mg by mouth daily.   Yes [provider]  rivaroxaban  (XARELTO ) 20 MG TABS tablet Take 20 mg by mouth daily.   Yes [provider]  Semaglutide 14 MG TABS Take 14 mg by mouth daily.   Yes [provider]  SUMAtriptan  (IMITREX ) 100 MG tablet Take 100 mg by mouth every 2 (two) hours as needed for migraine.    Yes [provider]  clindamycin  (CLEOCIN ) 300 MG capsule TAKE 1 CAPSULE BY MOUTH AS NEEDED (1 HOUR BEFORE DENTAL WORK ENDOCARDITIS PROPHYLAXIS) 01/24/20   Ladona Heinz, MD  mupirocin ointment (BACTROBAN) 2 % Place 1 Application into the nose See admin instructions. APPLY A SMALL AMOUNT TO a QTIP and RUB IN EACH  NOSTRIL 3 TIMES PER DAY FOR THE 7 DAYS PRIOR TO SURGERY 07/15/24   [provider]  nitrofurantoin , macrocrystal-monohydrate, (MACROBID ) 100 MG capsule Take 1 capsule (100 mg total) by mouth 2 (two) times daily. Patient not taking: Reported on 07/17/2024 05/05/24   Iola Lukes, FNP   Social History   Socioeconomic History   Marital status: Married    Spouse name: Not on file   Number of children: 2   Years of education: Not on file   Highest education level: Not on file  Occupational History   Not on file  Tobacco Use   Smoking status: Never   Smokeless tobacco: Never  Vaping Use   Vaping status: Never Used  Substance and Sexual Activity   Alcohol use: Yes    Comment: 1 drink a week   Drug use: No   Sexual activity: Not on file  Other Topics Concern   Not  on file  Social History Narrative   Not on file   Social Drivers of Health   Financial Resource Strain: Not on file  Food Insecurity: Not on file  Transportation Needs: Not on file  Physical Activity: Not on file  Stress: Not on file  Social Connections: Not on file   Family History  Problem Relation Age of Onset   Diabetes Mother    Heart disease Mother        murmur and cardiomyopathy   Heart disease Father    Arthritis-Osteo Father     ROS: Currently denies lightheadedness, dizziness, Fever, chills, CP, SOB.   No personal history of DVT, PE, MI, or CVA. No loose teeth or dentures All other systems have been reviewed and were otherwise currently negative with the exception of those mentioned in the HPI and as above.  Objective: Vitals: Ht: 5' 10 Wt: 151 lbs  Physical Exam: General: Alert, no acute distress Cardiovascular: No pedal edema Respiratory: No cyanosis, no use of accessory musculature GI: No organomegaly, abdomen is soft and non-tender Skin: No lesions in the area of chief complaint Neurologic: Sensation intact distally Psychiatric: Patient is competent for consent with normal  mood and affect Lymphatic: No axillary or cervical lymphadenopathy  MUSCULOSKELETAL: She has 0 to 90 degrees of forward flexion of the right hip. Pain elicited with passive and active internal and external rotation. Dorsiflexion plantarflexion intact at the ankle. Distal sensation is intact. 2+ DP and PT pulses.   Imaging Review Plain radiographs demonstrate severe degenerative joint disease of the right hip.   Preoperative templating of the joint replacement has been completed, documented, and submitted to the Operating Room personnel in order to optimize intra-operative equipment management.  Assessment: osteoarthritis of right hip    Plan: Plan for Procedure(s): ARTHROPLASTY, HIP, TOTAL,POSTERIOR APPROACH  The patient history, physical exam, clinical judgement of the provider and imaging are consistent with end stage degenerative joint disease and total joint arthroplasty is deemed medically necessary. The treatment options including medical management, injection therapy, and arthroplasty were discussed at length. The risks and benefits of Procedure(s): ARTHROPLASTY, HIP, TOTAL,POSTERIOR APPROACH were presented and reviewed.  The risks of nonoperative treatment, versus surgical intervention including but not limited to continued pain, aseptic loosening, stiffness, dislocation/subluxation, infection, bleeding, nerve injury, blood clots, cardiopulmonary complications, morbidity, mortality, among others were discussed. The patient verbalizes understanding and wishes to proceed with the plan.  Patient is being admitted for surgery, pain control, PT, prophylactic antibiotics, VTE prophylaxis, progressive ambulation, ADL's and discharge planning.    The patient does meet the criteria for TXA which will be used perioperatively.   Baseline Xarelto   will be used postoperatively for DVT prophylaxis in addition to SCDs, and early ambulation. 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

## 2024-07-30 ENCOUNTER — Observation Stay (HOSPITAL_COMMUNITY)

## 2024-07-30 ENCOUNTER — Ambulatory Visit (HOSPITAL_COMMUNITY): Payer: Self-pay | Admitting: Vascular Surgery

## 2024-07-30 ENCOUNTER — Other Ambulatory Visit: Payer: Self-pay

## 2024-07-30 ENCOUNTER — Encounter (HOSPITAL_COMMUNITY): Payer: Self-pay | Admitting: Orthopedic Surgery

## 2024-07-30 ENCOUNTER — Encounter (HOSPITAL_COMMUNITY): Admission: RE | Disposition: A | Payer: Self-pay | Source: Home / Self Care | Attending: Orthopedic Surgery

## 2024-07-30 ENCOUNTER — Observation Stay (HOSPITAL_COMMUNITY)
Admission: RE | Admit: 2024-07-30 | Discharge: 2024-07-31 | Disposition: A | Payer: Self-pay | Attending: Orthopedic Surgery | Admitting: Orthopedic Surgery

## 2024-07-30 DIAGNOSIS — Z96653 Presence of artificial knee joint, bilateral: Secondary | ICD-10-CM | POA: Insufficient documentation

## 2024-07-30 DIAGNOSIS — Z7984 Long term (current) use of oral hypoglycemic drugs: Secondary | ICD-10-CM | POA: Diagnosis not present

## 2024-07-30 DIAGNOSIS — M1611 Unilateral primary osteoarthritis, right hip: Secondary | ICD-10-CM | POA: Diagnosis not present

## 2024-07-30 DIAGNOSIS — Z7901 Long term (current) use of anticoagulants: Secondary | ICD-10-CM | POA: Insufficient documentation

## 2024-07-30 DIAGNOSIS — E119 Type 2 diabetes mellitus without complications: Secondary | ICD-10-CM

## 2024-07-30 DIAGNOSIS — Z96641 Presence of right artificial hip joint: Principal | ICD-10-CM | POA: Diagnosis present

## 2024-07-30 DIAGNOSIS — Z950 Presence of cardiac pacemaker: Secondary | ICD-10-CM | POA: Insufficient documentation

## 2024-07-30 DIAGNOSIS — F419 Anxiety disorder, unspecified: Secondary | ICD-10-CM | POA: Diagnosis not present

## 2024-07-30 DIAGNOSIS — Z79899 Other long term (current) drug therapy: Secondary | ICD-10-CM | POA: Insufficient documentation

## 2024-07-30 DIAGNOSIS — Z01818 Encounter for other preprocedural examination: Secondary | ICD-10-CM

## 2024-07-30 HISTORY — PX: TOTAL HIP ARTHROPLASTY: SHX124

## 2024-07-30 LAB — GLUCOSE, CAPILLARY
Glucose-Capillary: 108 mg/dL — ABNORMAL HIGH (ref 70–99)
Glucose-Capillary: 100 mg/dL — ABNORMAL HIGH (ref 70–99)
Glucose-Capillary: 108 mg/dL — ABNORMAL HIGH (ref 70–99)
Glucose-Capillary: 124 mg/dL — ABNORMAL HIGH (ref 70–99)
Glucose-Capillary: 192 mg/dL — ABNORMAL HIGH (ref 70–99)

## 2024-07-30 SURGERY — ARTHROPLASTY, HIP, TOTAL,POSTERIOR APPROACH
Anesthesia: Spinal | Site: Hip | Laterality: Right

## 2024-07-30 MED ORDER — EMPAGLIFLOZIN-METFORMIN HCL 12.5-500 MG PO TABS: 1.0000 | ORAL_TABLET | Freq: Two times a day (BID) | ORAL | Status: DC

## 2024-07-30 MED ORDER — GABAPENTIN 300 MG PO CAPS
300.0000 mg | ORAL_CAPSULE | Freq: Once | ORAL | Status: AC
  Administered 2024-07-30: 300 mg via ORAL
  Filled 2024-07-30: qty 1

## 2024-07-30 MED ORDER — ALUM & MAG HYDROXIDE-SIMETH 200-200-20 MG/5ML PO SUSP: 30.0000 mL | ORAL | Status: DC | PRN

## 2024-07-30 MED ORDER — MENTHOL 3 MG MT LOZG: 1.0000 | LOZENGE | OROMUCOSAL | Status: DC | PRN

## 2024-07-30 MED ORDER — FENTANYL CITRATE (PF) 250 MCG/5ML IJ SOLN
INTRAMUSCULAR | Status: DC | PRN
  Administered 2024-07-30 (×2): 50 ug via INTRAVENOUS

## 2024-07-30 MED ORDER — MIDAZOLAM HCL (PF) 2 MG/2ML IJ SOLN
INTRAMUSCULAR | Status: DC | PRN
  Administered 2024-07-30: 2 mg via INTRAVENOUS

## 2024-07-30 MED ORDER — LACTATED RINGERS IV SOLN: INTRAVENOUS | Status: DC

## 2024-07-30 MED ORDER — ALBUMIN HUMAN 5 % IV SOLN
INTRAVENOUS | Status: AC
  Filled 2024-07-30: qty 250

## 2024-07-30 MED ORDER — ONDANSETRON HCL 4 MG/2ML IJ SOLN
INTRAMUSCULAR | Status: AC
  Filled 2024-07-30: qty 2

## 2024-07-30 MED ORDER — ALBUMIN HUMAN 5 % IV SOLN
12.5000 g | Freq: Once | INTRAVENOUS | Status: AC
  Administered 2024-07-30: 12.5 g via INTRAVENOUS

## 2024-07-30 MED ORDER — BUPIVACAINE HCL (PF) 0.25 % IJ SOLN
INTRAMUSCULAR | Status: DC | PRN
  Administered 2024-07-30: 30 mL

## 2024-07-30 MED ORDER — POLYETHYLENE GLYCOL 3350 17 G PO PACK: 17.0000 g | PACK | Freq: Every day | ORAL | Status: DC | PRN

## 2024-07-30 MED ORDER — ONDANSETRON HCL 4 MG PO TABS: 4.0000 mg | ORAL_TABLET | Freq: Four times a day (QID) | ORAL | Status: DC | PRN

## 2024-07-30 MED ORDER — SUMATRIPTAN SUCCINATE 100 MG PO TABS: 100.0000 mg | ORAL_TABLET | ORAL | Status: DC | PRN

## 2024-07-30 MED ORDER — MIDAZOLAM HCL 2 MG/2ML IJ SOLN
INTRAMUSCULAR | Status: AC
  Filled 2024-07-30: qty 2

## 2024-07-30 MED ORDER — POVIDONE-IODINE 10 % EX SWAB
2.0000 | Freq: Once | CUTANEOUS | Status: AC
  Administered 2024-07-30: 2 via TOPICAL

## 2024-07-30 MED ORDER — METOCLOPRAMIDE HCL 5 MG PO TABS: 5.0000 mg | ORAL_TABLET | Freq: Three times a day (TID) | ORAL | Status: DC | PRN

## 2024-07-30 MED ORDER — CHLORHEXIDINE GLUCONATE 0.12 % MT SOLN
15.0000 mL | Freq: Once | OROMUCOSAL | Status: AC
  Administered 2024-07-30: 15 mL via OROMUCOSAL
  Filled 2024-07-30: qty 15

## 2024-07-30 MED ORDER — PROPOFOL 500 MG/50ML IV EMUL
INTRAVENOUS | Status: DC | PRN
  Administered 2024-07-30: 75 ug/kg/min via INTRAVENOUS

## 2024-07-30 MED ORDER — INSULIN ASPART 100 UNIT/ML IJ SOLN
0.0000 [IU] | Freq: Three times a day (TID) | INTRAMUSCULAR | Status: DC
  Administered 2024-07-31: 2 [IU] via SUBCUTANEOUS
  Filled 2024-07-30 (×2): qty 2

## 2024-07-30 MED ORDER — POTASSIUM GLUCONATE 595 (99 K) MG PO TABS: 595.0000 mg | ORAL_TABLET | Freq: Every day | ORAL | Status: DC

## 2024-07-30 MED ORDER — TRANEXAMIC ACID-NACL 1000-0.7 MG/100ML-% IV SOLN
1000.0000 mg | Freq: Once | INTRAVENOUS | Status: AC
  Administered 2024-07-30: 1000 mg via INTRAVENOUS
  Filled 2024-07-30: qty 100

## 2024-07-30 MED ORDER — PROPOFOL 10 MG/ML IV BOLUS
INTRAVENOUS | Status: AC
  Filled 2024-07-30: qty 20

## 2024-07-30 MED ORDER — TRANEXAMIC ACID-NACL 1000-0.7 MG/100ML-% IV SOLN
1000.0000 mg | INTRAVENOUS | Status: AC
  Administered 2024-07-30: 1000 mg via INTRAVENOUS
  Filled 2024-07-30: qty 100

## 2024-07-30 MED ORDER — OXYCODONE HCL 5 MG PO TABS
10.0000 mg | ORAL_TABLET | ORAL | Status: DC | PRN
  Administered 2024-07-30 – 2024-07-31 (×2): 10 mg via ORAL
  Filled 2024-07-30 (×2): qty 2

## 2024-07-30 MED ORDER — PHENOL 1.4 % MT LIQD: 1.0000 | OROMUCOSAL | Status: DC | PRN

## 2024-07-30 MED ORDER — EMPAGLIFLOZIN 25 MG PO TABS
25.0000 mg | ORAL_TABLET | Freq: Every day | ORAL | Status: DC
  Administered 2024-07-31: 25 mg via ORAL
  Filled 2024-07-30: qty 1

## 2024-07-30 MED ORDER — CEFAZOLIN SODIUM-DEXTROSE 2-4 GM/100ML-% IV SOLN
2.0000 g | Freq: Four times a day (QID) | INTRAVENOUS | Status: AC
  Administered 2024-07-30 – 2024-07-31 (×2): 2 g via INTRAVENOUS
  Filled 2024-07-30 (×2): qty 100

## 2024-07-30 MED ORDER — ONDANSETRON HCL 4 MG/2ML IJ SOLN
INTRAMUSCULAR | Status: DC | PRN
  Administered 2024-07-30: 4 mg via INTRAVENOUS

## 2024-07-30 MED ORDER — EMPAGLIFLOZIN 10 MG PO TABS: 10.0000 mg | ORAL_TABLET | Freq: Every day | ORAL | Status: DC

## 2024-07-30 MED ORDER — HYDROMORPHONE HCL 1 MG/ML IJ SOLN
0.5000 mg | INTRAMUSCULAR | Status: DC | PRN
  Administered 2024-07-31: 1 mg via INTRAVENOUS
  Filled 2024-07-30: qty 1

## 2024-07-30 MED ORDER — PHENYLEPHRINE 80 MCG/ML (10ML) SYRINGE FOR IV PUSH (FOR BLOOD PRESSURE SUPPORT)
PREFILLED_SYRINGE | INTRAVENOUS | Status: DC | PRN
  Administered 2024-07-30: 80 ug via INTRAVENOUS

## 2024-07-30 MED ORDER — PROPOFOL 10 MG/ML IV BOLUS
INTRAVENOUS | Status: DC | PRN
  Administered 2024-07-30: 20 mg via INTRAVENOUS

## 2024-07-30 MED ORDER — ACETAMINOPHEN 325 MG PO TABS: 325.0000 mg | ORAL_TABLET | Freq: Four times a day (QID) | ORAL | Status: DC | PRN

## 2024-07-30 MED ORDER — FUROSEMIDE 20 MG PO TABS: 10.0000 mg | ORAL_TABLET | Freq: Every day | ORAL | Status: DC | PRN

## 2024-07-30 MED ORDER — DROPERIDOL 2.5 MG/ML IJ SOLN: 0.6250 mg | Freq: Once | INTRAMUSCULAR | Status: DC | PRN

## 2024-07-30 MED ORDER — DOCUSATE SODIUM 100 MG PO CAPS
100.0000 mg | ORAL_CAPSULE | Freq: Two times a day (BID) | ORAL | Status: DC
  Administered 2024-07-30 – 2024-07-31 (×2): 100 mg via ORAL
  Filled 2024-07-30 (×2): qty 1

## 2024-07-30 MED ORDER — ATORVASTATIN CALCIUM 40 MG PO TABS
40.0000 mg | ORAL_TABLET | Freq: Every day | ORAL | Status: DC
  Administered 2024-07-31: 40 mg via ORAL
  Filled 2024-07-30: qty 1

## 2024-07-30 MED ORDER — BUPIVACAINE HCL (PF) 0.25 % IJ SOLN
INTRAMUSCULAR | Status: AC
  Filled 2024-07-30: qty 30

## 2024-07-30 MED ORDER — MAGNESIUM 30 MG PO TABS: 30.0000 mg | ORAL_TABLET | Freq: Every day | ORAL | Status: DC

## 2024-07-30 MED ORDER — PHENYLEPHRINE HCL-NACL 20-0.9 MG/250ML-% IV SOLN
INTRAVENOUS | Status: DC | PRN
  Administered 2024-07-30: 40 ug/min via INTRAVENOUS

## 2024-07-30 MED ORDER — LIDOCAINE 2% (20 MG/ML) 5 ML SYRINGE
INTRAMUSCULAR | Status: AC
  Filled 2024-07-30: qty 5

## 2024-07-30 MED ORDER — MAGNESIUM CITRATE PO SOLN: 1.0000 | Freq: Once | ORAL | Status: DC | PRN

## 2024-07-30 MED ORDER — BUPIVACAINE IN DEXTROSE 0.75-8.25 % IT SOLN
INTRATHECAL | Status: DC | PRN
  Administered 2024-07-30: 2 mL via INTRATHECAL

## 2024-07-30 MED ORDER — ACETAMINOPHEN 500 MG PO TABS
1000.0000 mg | ORAL_TABLET | Freq: Once | ORAL | Status: DC
  Filled 2024-07-30: qty 2

## 2024-07-30 MED ORDER — DEXAMETHASONE SOD PHOSPHATE PF 10 MG/ML IJ SOLN
INTRAMUSCULAR | Status: DC | PRN
  Administered 2024-07-30: 10 mg via INTRAVENOUS

## 2024-07-30 MED ORDER — PAROXETINE HCL 20 MG PO TABS
20.0000 mg | ORAL_TABLET | Freq: Every day | ORAL | Status: DC
  Administered 2024-07-31: 20 mg via ORAL
  Filled 2024-07-30: qty 1

## 2024-07-30 MED ORDER — FENTANYL CITRATE (PF) 100 MCG/2ML IJ SOLN
INTRAMUSCULAR | Status: AC
  Filled 2024-07-30: qty 2

## 2024-07-30 MED ORDER — METFORMIN HCL 500 MG PO TABS
500.0000 mg | ORAL_TABLET | Freq: Two times a day (BID) | ORAL | Status: DC
  Administered 2024-07-30 – 2024-07-31 (×2): 500 mg via ORAL
  Filled 2024-07-30 (×2): qty 1

## 2024-07-30 MED ORDER — METHOCARBAMOL 1000 MG/10ML IJ SOLN: 500.0000 mg | Freq: Four times a day (QID) | INTRAMUSCULAR | Status: DC | PRN

## 2024-07-30 MED ORDER — STERILE WATER FOR IRRIGATION IR SOLN
Status: DC | PRN
  Administered 2024-07-30: 1000 mL

## 2024-07-30 MED ORDER — ZOLPIDEM TARTRATE 5 MG PO TABS: 5.0000 mg | ORAL_TABLET | Freq: Every evening | ORAL | Status: DC | PRN

## 2024-07-30 MED ORDER — BISACODYL 10 MG RE SUPP: 10.0000 mg | Freq: Every day | RECTAL | Status: DC | PRN

## 2024-07-30 MED ORDER — ORAL CARE MOUTH RINSE: 15.0000 mL | Freq: Once | OROMUCOSAL | Status: AC

## 2024-07-30 MED ORDER — HYDROMORPHONE HCL 1 MG/ML IJ SOLN: 0.2500 mg | INTRAMUSCULAR | Status: DC | PRN

## 2024-07-30 MED ORDER — DIPHENHYDRAMINE HCL 12.5 MG/5ML PO ELIX: 12.5000 mg | ORAL_SOLUTION | ORAL | Status: DC | PRN

## 2024-07-30 MED ORDER — METOCLOPRAMIDE HCL 5 MG/ML IJ SOLN: 5.0000 mg | Freq: Three times a day (TID) | INTRAMUSCULAR | Status: DC | PRN

## 2024-07-30 MED ORDER — RIVAROXABAN 20 MG PO TABS: 20.0000 mg | ORAL_TABLET | Freq: Every day | ORAL | Status: DC

## 2024-07-30 MED ORDER — CEFAZOLIN SODIUM-DEXTROSE 2-4 GM/100ML-% IV SOLN
2.0000 g | INTRAVENOUS | Status: AC
  Administered 2024-07-30: 2 g via INTRAVENOUS
  Filled 2024-07-30: qty 100

## 2024-07-30 MED ORDER — POTASSIUM CHLORIDE IN NACL 20-0.9 MEQ/L-% IV SOLN: INTRAVENOUS | Status: DC

## 2024-07-30 MED ORDER — OXYCODONE HCL 5 MG PO TABS
5.0000 mg | ORAL_TABLET | ORAL | Status: DC | PRN
  Administered 2024-07-30 – 2024-07-31 (×2): 10 mg via ORAL
  Filled 2024-07-30 (×2): qty 2

## 2024-07-30 MED ORDER — PANTOPRAZOLE SODIUM 40 MG PO TBEC
40.0000 mg | DELAYED_RELEASE_TABLET | Freq: Every day | ORAL | Status: DC
  Administered 2024-07-30 – 2024-07-31 (×2): 40 mg via ORAL
  Filled 2024-07-30 (×2): qty 1

## 2024-07-30 MED ORDER — ACETAMINOPHEN 500 MG PO TABS: 1000.0000 mg | ORAL_TABLET | Freq: Once | ORAL | Status: DC

## 2024-07-30 MED ORDER — METHOCARBAMOL 500 MG PO TABS: 500.0000 mg | ORAL_TABLET | Freq: Four times a day (QID) | ORAL | Status: DC | PRN

## 2024-07-30 MED ORDER — BISOPROLOL FUMARATE 10 MG PO TABS
10.0000 mg | ORAL_TABLET | Freq: Two times a day (BID) | ORAL | Status: DC
  Administered 2024-07-30 – 2024-07-31 (×2): 10 mg via ORAL
  Filled 2024-07-30 (×3): qty 1

## 2024-07-30 MED ORDER — METFORMIN HCL 500 MG PO TABS: 500.0000 mg | ORAL_TABLET | Freq: Two times a day (BID) | ORAL | Status: DC

## 2024-07-30 MED ORDER — ONDANSETRON HCL 4 MG/2ML IJ SOLN: 4.0000 mg | Freq: Four times a day (QID) | INTRAMUSCULAR | Status: DC | PRN

## 2024-07-30 MED ORDER — ACETAMINOPHEN 500 MG PO TABS
1000.0000 mg | ORAL_TABLET | Freq: Four times a day (QID) | ORAL | Status: AC
  Administered 2024-07-30 – 2024-07-31 (×4): 1000 mg via ORAL
  Filled 2024-07-30 (×4): qty 2

## 2024-07-30 MED ORDER — SODIUM CHLORIDE 0.9 % IR SOLN
Status: DC | PRN
  Administered 2024-07-30: 1000 mL

## 2024-07-30 SURGICAL SUPPLY — 47 items
BAG COUNTER SPONGE SURGICOUNT (BAG) ×1 IMPLANT
BIT DRILL 5/64X5 DISP (BIT) ×1 IMPLANT
BLADE SAW SGTL 73X25 THK (BLADE) ×1 IMPLANT
CLSR STERI-STRIP ANTIMIC 1/2X4 (GAUZE/BANDAGES/DRESSINGS) IMPLANT
COVER SURGICAL LIGHT HANDLE (MISCELLANEOUS) ×1 IMPLANT
CUP ACET PINNACLE SECTR 48MM (Joint) IMPLANT
DRAPE INCISE IOBAN 66X45 STRL (DRAPES) IMPLANT
DRAPE SURG ORHT 6 SPLT 77X108 (DRAPES) ×2 IMPLANT
DRAPE U-SHAPE 47X51 STRL (DRAPES) ×1 IMPLANT
DRSG MEPILEX POST OP 4X12 (GAUZE/BANDAGES/DRESSINGS) IMPLANT
DRSG MEPILEX POST OP 4X8 (GAUZE/BANDAGES/DRESSINGS) IMPLANT
DURAPREP 26ML APPLICATOR (WOUND CARE) ×1 IMPLANT
ELECT CAUTERY BLADE 6.4 (BLADE) ×1 IMPLANT
ELECTRODE BLDE 4.0 EZ CLN MEGD (MISCELLANEOUS) IMPLANT
ELECTRODE REM PT RTRN 9FT ADLT (ELECTROSURGICAL) ×1 IMPLANT
ELIMINATOR HOLE APEX DEPUY (Hips) IMPLANT
GLOVE BIO SURGEON STRL SZ7 (GLOVE) ×2 IMPLANT
GLOVE BIOGEL PI IND STRL 7.0 (GLOVE) ×1 IMPLANT
GLOVE PI ORTHO PRO STRL 7.5 (GLOVE) ×1 IMPLANT
GOWN STRL REUS W/ TWL LRG LVL3 (GOWN DISPOSABLE) ×1 IMPLANT
GOWN STRL REUS W/ TWL XL LVL3 (GOWN DISPOSABLE) ×1 IMPLANT
HEAD FEMORAL 32 CERAMIC (Hips) IMPLANT
HOOD PEEL AWAY T7 (MISCELLANEOUS) ×2 IMPLANT
HOOD W/PEELAWAY (MISCELLANEOUS) ×1 IMPLANT
KIT BASIN OR (CUSTOM PROCEDURE TRAY) ×1 IMPLANT
KIT TURNOVER KIT B (KITS) ×1 IMPLANT
MANIFOLD NEPTUNE II (INSTRUMENTS) ×1 IMPLANT
NDL 18GX1X1/2 (RX/OR ONLY) (NEEDLE) ×1 IMPLANT
PACK TOTAL JOINT (CUSTOM PROCEDURE TRAY) ×1 IMPLANT
PAD ARMBOARD POSITIONER FOAM (MISCELLANEOUS) ×2 IMPLANT
PINN ALTRX NEUT ID X OD 32X48 IMPLANT
PRESSURIZER FEMORAL UNIV (MISCELLANEOUS) IMPLANT
RETRIEVER SUT HEWSON (MISCELLANEOUS) ×1 IMPLANT
SCREW 6.5MMX25MM (Screw) IMPLANT
SOLN 0.9% NACL POUR BTL 1000ML (IV SOLUTION) ×1 IMPLANT
STEM FEM ACTIS STD SZ2 (Stem) IMPLANT
SUCTION TUBE FRAZIER 10FR DISP (SUCTIONS) ×1 IMPLANT
SUT ETHIBOND NAB CT1 #1 30IN (SUTURE) ×2 IMPLANT
SUT STRATAFIX 1PDS 45CM VIOLET (SUTURE) ×1 IMPLANT
SUT VIC AB 0 CT1 27XBRD ANBCTR (SUTURE) ×1 IMPLANT
SUT VIC AB 2-0 CT1 TAPERPNT 27 (SUTURE) ×1 IMPLANT
SUT VIC AB 3-0 SH 8-18 (SUTURE) ×1 IMPLANT
SUTURE FIBERWR#2 38 REV NDL BL (SUTURE) IMPLANT
SYR CONTROL 10ML LL (SYRINGE) ×1 IMPLANT
TOWEL GREEN STERILE (TOWEL DISPOSABLE) ×1 IMPLANT
TOWEL GREEN STERILE FF (TOWEL DISPOSABLE) ×1 IMPLANT
TRAY FOLEY W/BAG SLVR 14FR (SET/KITS/TRAYS/PACK) IMPLANT

## 2024-07-30 NOTE — Interval H&P Note (Signed)
 History and Physical Interval Note:  07/30/2024 12:05 PM  Heidi Webster  has presented today for surgery, with the diagnosis of osteoarthritis of one hip.  The various methods of treatment have been discussed with the patient and family. After consideration of risks, benefits and other options for treatment, the patient has consented to  Procedure(s): ARTHROPLASTY, HIP, TOTAL,POSTERIOR APPROACH (Right) as a surgical intervention.  The patient's history has been reviewed, patient examined, no change in status, stable for surgery.  I have reviewed the patient's chart and labs.  Questions were answered to the patient's satisfaction.     Fonda SHAUNNA Olmsted

## 2024-07-30 NOTE — Anesthesia Postprocedure Evaluation (Signed)
 Anesthesia Post Note  Patient: Heidi Webster  Procedure(s) Performed: ARTHROPLASTY, HIP, TOTAL,POSTERIOR APPROACH, RIGHT (Right: Hip)     Patient location during evaluation: PACU Anesthesia Type: Spinal Level of consciousness: awake and alert Pain management: pain level controlled Vital Signs Assessment: post-procedure vital signs reviewed and stable Respiratory status: spontaneous breathing, nonlabored ventilation and respiratory function stable Cardiovascular status: blood pressure returned to baseline and stable Postop Assessment: no apparent nausea or vomiting Anesthetic complications: no   No notable events documented.  Last Vitals:  Vitals:   07/30/24 1615 07/30/24 1640  BP: (!) 109/45 (!) 110/56  Pulse: 65 65  Resp: 14 18  Temp: 36.8 C 36.4 C  SpO2: 94% 94%    Last Pain:  Vitals:   07/30/24 1640  TempSrc: Oral  PainSc:                  Butler Levander Pinal

## 2024-07-30 NOTE — Plan of Care (Signed)
  Problem: Education: Goal: Knowledge of General Education information will improve Description: Including pain rating scale, medication(s)/side effects and non-pharmacologic comfort measures Outcome: Progressing   Problem: Clinical Measurements: Goal: Ability to maintain clinical measurements within normal limits will improve Outcome: Progressing Goal: Will remain free from infection Outcome: Progressing Goal: Diagnostic test results will improve Outcome: Progressing Goal: Respiratory complications will improve Outcome: Progressing Goal: Cardiovascular complication will be avoided Outcome: Progressing   Problem: Activity: Goal: Risk for activity intolerance will decrease Outcome: Progressing   Problem: Nutrition: Goal: Adequate nutrition will be maintained Outcome: Progressing   Problem: Coping: Goal: Level of anxiety will decrease Outcome: Progressing   Problem: Elimination: Goal: Will not experience complications related to bowel motility Outcome: Progressing Goal: Will not experience complications related to urinary retention Outcome: Progressing   Problem: Pain Managment: Goal: General experience of comfort will improve and/or be controlled Outcome: Progressing   Problem: Safety: Goal: Ability to remain free from injury will improve Outcome: Progressing   Problem: Skin Integrity: Goal: Risk for impaired skin integrity will decrease Outcome: Progressing   Problem: Education: Goal: Ability to describe self-care measures that may prevent or decrease complications (Diabetes Survival Skills Education) will improve Outcome: Progressing   Problem: Coping: Goal: Ability to adjust to condition or change in health will improve Outcome: Progressing   Problem: Fluid Volume: Goal: Ability to maintain a balanced intake and output will improve Outcome: Progressing   Problem: Metabolic: Goal: Ability to maintain appropriate glucose levels will improve Outcome:  Progressing   Problem: Nutritional: Goal: Maintenance of adequate nutrition will improve Outcome: Progressing   Problem: Skin Integrity: Goal: Risk for impaired skin integrity will decrease Outcome: Progressing   Problem: Tissue Perfusion: Goal: Adequacy of tissue perfusion will improve Outcome: Progressing   Problem: Education: Goal: Knowledge of the prescribed therapeutic regimen will improve Outcome: Progressing Goal: Understanding of discharge needs will improve Outcome: Progressing   Problem: Activity: Goal: Ability to avoid complications of mobility impairment will improve Outcome: Progressing   Problem: Clinical Measurements: Goal: Postoperative complications will be avoided or minimized Outcome: Progressing   Problem: Pain Management: Goal: Pain level will decrease with appropriate interventions Outcome: Progressing   Problem: Skin Integrity: Goal: Will show signs of wound healing Outcome: Progressing

## 2024-07-30 NOTE — TOC CM/SW Note (Signed)
 TOC consult received for d/c planning needs, possible SNF vs. HH. Unit CM to f/u with patient as appropriate.   Merilee Batty, MSN, RN Case Management [phone]

## 2024-07-30 NOTE — Op Note (Signed)
 07/30/2024  2:48 PM  PATIENT:  Heidi Webster   MRN: [id number]  PRE-OPERATIVE DIAGNOSIS: Right hip primary localized osteoarthritis  POST-OPERATIVE DIAGNOSIS:  same  PROCEDURE:  Procedure(s): ARTHROPLASTY, HIP, TOTAL,POSTERIOR APPROACH, RIGHT  PREOPERATIVE INDICATIONS:    Heidi Webster is an 63 y.o. female who has a diagnosis of right hip osteoarthritis and elected for surgical management after failing conservative treatment.  The risks benefits and alternatives were discussed with the patient including but not limited to the risks of nonoperative treatment, versus surgical intervention including infection, bleeding, nerve injury, periprosthetic fracture, the need for revision surgery, dislocation, leg length discrepancy, blood clots, cardiopulmonary complications, morbidity, mortality, among others, and they were willing to proceed.     OPERATIVE REPORT     SURGEON:  Fonda Olmsted, MD    ASSISTANT:  Army Daring, PA-C, (Present throughout the entire procedure,  necessary for completion of procedure in a timely manner, assisting with retraction, instrumentation, and closure)     ANESTHESIA: Spinal  ESTIMATED BLOOD LOSS: 300 mL    COMPLICATIONS:  None.     UNIQUE ASPECTS OF THE CASE: Her anatomy was fairly small.  I got to the full depth of the acetabulum, and had just a slight bit of anterior wall showing, and had beads visible both superiorly and posteriorly.  She was very stable once all the implants were in, I did not think I was lengthening her, she did have overall tightness with extension but I was able to get the capsular repair back to the bone.  The capsule however was fairly contracted.  She had substantial cystic formation in her acetabulum which I did place reamings into the cysts.  COMPONENTS:  Implant Name: CUP ACET PINNACLE SECTR - [id number] Type: Joint Inv. Item: CUP ACET PINNACLE SECTR Serial No.: [id number] Manufacturer: [employer] Lot No.:  [id number] LRB: Right No. Used: 1 Action: Implanted   Implant Name: ARMAN DAKIN APEX DEPUY - [id number] Type: Hips Inv. Item: ELIMINATOR HOLE APEX DEPUY Serial No.: [id number] Manufacturer: [employer] Lot No.: [id number] LRB: Right No. Used: 1 Action: Implanted   Implant Name: SCREW 6.5MMX25MM - [id number] Type: Screw Inv. Item: SCREW 6.5MMX25MM Serial No.: [id number] Manufacturer: [employer] Lot No.: [id number] LRB: Right No. Used: 1 Action: Implanted   Implant Name: PINN ALTRX NEUT ID X OD [id number] - [id number] Type:  Inv. Item: PINN ALTRX NEUT ID X OD [id number] Serial No.: [id number] Manufacturer: [employer] Lot No.: [id number] LRB: Right No. Used: 1 Action: Implanted   Implant Name: HEAD FEMORAL 32 CERAMIC - [id number] Type: Hips Inv. Item: HEAD FEMORAL 32 CERAMIC Serial No.: [id number] Manufacturer: [employer] Lot No.: [id number] LRB: Right No. Used: 1 Action: Implanted   Implant Name: STEM FEM ACTIS STD SZ2 - [id number] Type: Stem Inv. Item: STEM FEM ACTIS STD SZ2 Serial No.: [id number] Manufacturer: [employer] Lot No.: [id number] LRB: Right No. Used: 1 Action: Implanted     PROCEDURE IN DETAIL:   The patient was met in the holding area and  identified.  The appropriate hip was identified and marked at the operative site.  The patient was then transported to the OR  and  placed under anesthesia.  At that point, the patient was  placed in the lateral decubitus position with the operative side up and  secured to the operating room table and all bony prominences padded.     The operative lower extremity was prepped from the iliac crest to the distal leg.  Sterile draping  was performed.  Time out was performed prior to incision.      A routine posterolateral approach was utilized via sharp dissection  carried down through the subcutaneous tissue.  Gross bleeders were Bovie coagulated.  The iliotibial band was  identified and incised along the length of the skin incision.  Self-retaining retractors were  inserted.  With the hip internally rotated, the short external rotators  were identified. The piriformis and capsule was tagged with Ethibond, and the hip capsule released in a T-type fashion.  The femoral neck was exposed, and I resected the femoral neck using the appropriate jig. This was performed at approximately a thumb's breadth above the lesser trochanter.    I then exposed the deep acetabulum, cleared out any tissue including the ligamentum teres.  A wing retractor was placed.  After adequate visualization, I excised the labrum, and then sequentially reamed.  I placed the trial acetabulum, which seated nicely, and then impacted the real cup into place.  Appropriate version and inclination was confirmed clinically matching their bony anatomy, and also with the use of the jig.  I placed a cancellous screw to augment fixation.  A trial polyethylene liner was placed and the wing retractor removed.    I then prepared the proximal femur using the cookie-cutter, the lateralizing reamer, and then sequentially reamed and broached.  A trial broach, neck, and head was utilized, and I reduced the hip and it was found to have excellent stability with functional range of motion. The trial components were then removed, and the real polyethylene liner was placed.  I then impacted the real femoral prosthesis into place into the appropriate version, slightly anteverted to the normal anatomy, and I impacted the real head ball into place. The hip was then reduced and taken through functional range of motion and found to have excellent stability. Leg lengths were restored.  I then used a 2 mm drill bits to pass the Ethibond suture from the capsule and piriformis through the greater trochanter, and secured this. Excellent posterior capsular repair was achieved. I also closed the T in the capsule.  I then irrigated the  hip copiously again, and repaired the fascia with Stratafix, followed by Vicryl for the subcutaneous tissue, Monocryl for the skin, Steri-Strips and sterile gauze. The wounds were injected. The patient was then awakened and returned to PACU in stable and satisfactory condition. There were no complications.  Fonda Olmsted, MD Orthopedic Surgeon [phone]   07/30/2024 2:48 PM

## 2024-07-30 NOTE — Transfer of Care (Signed)
 Immediate Anesthesia Transfer of Care Note  Patient: Heidi Webster  Procedure(s) Performed: ARTHROPLASTY, HIP, TOTAL,POSTERIOR APPROACH, RIGHT (Right: Hip)  Patient Location: PACU  Anesthesia Type:Spinal  Level of Consciousness: awake, alert , and oriented  Airway & Oxygen Therapy: Patient Spontanous Breathing and Patient connected to nasal cannula oxygen  Post-op Assessment: Report given to RN and Post -op Vital signs reviewed and stable  Post vital signs: Reviewed and stable  Last Vitals:  Vitals Value Taken Time  BP 139/126 07/30/24 15:20  Temp    Pulse 79 07/30/24 15:22  Resp 13 07/30/24 15:22  SpO2 94 % 07/30/24 15:22  Vitals shown include unfiled device data.  Last Pain:  Vitals:   07/30/24 1039  PainSc: 0-No pain         Complications: No notable events documented.

## 2024-07-30 NOTE — Anesthesia Procedure Notes (Signed)
 Spinal  Patient location during procedure: OR Start time: 07/30/2024 1:02 PM End time: 07/30/2024 1:07 PM Reason for block: surgical anesthesia Staffing Performed: anesthesiologist  Anesthesiologist: Cleotilde Butler Dade, MD Performed by: Cleotilde Butler Dade, MD Authorized by: Cleotilde Butler Dade, MD   Preanesthetic Checklist Completed: patient identified, IV checked, site marked, risks and benefits discussed, surgical consent, monitors and equipment checked, pre-op evaluation and timeout performed Spinal Block Patient position: sitting Prep: DuraPrep Patient monitoring: heart rate, cardiac monitor, continuous pulse ox and blood pressure Approach: midline Location: L3-4 Injection technique: single-shot Needle Needle type: Sprotte  Needle gauge: 24 G Needle length: 9 cm Assessment Sensory level: T4 Events: CSF return

## 2024-07-30 NOTE — Plan of Care (Signed)
   Problem: Clinical Measurements: Goal: Ability to maintain clinical measurements within normal limits will improve Outcome: Progressing Goal: Diagnostic test results will improve Outcome: Progressing   Problem: Activity: Goal: Risk for activity intolerance will decrease Outcome: Progressing   Problem: Nutrition: Goal: Adequate nutrition will be maintained Outcome: Progressing   Problem: Coping: Goal: Level of anxiety will decrease Outcome: Progressing

## 2024-07-30 NOTE — Discharge Instructions (Signed)

## 2024-07-31 ENCOUNTER — Encounter (HOSPITAL_COMMUNITY): Payer: Self-pay | Admitting: Orthopedic Surgery

## 2024-07-31 LAB — BASIC METABOLIC PANEL WITH GFR
Anion gap: 12 (ref 5–15)
BUN: 6 mg/dL — ABNORMAL LOW (ref 8–23)
CO2: 27 mmol/L (ref 22–32)
Calcium: 8.9 mg/dL (ref 8.9–10.3)
Chloride: 103 mmol/L (ref 98–111)
Creatinine, Ser: 0.71 mg/dL (ref 0.44–1.00)
GFR, Estimated: >60 mL/min (ref >60)
Glucose, Bld: 120 mg/dL — ABNORMAL HIGH (ref 70–99)
Potassium: 4.4 mmol/L (ref 3.5–5.1)
Sodium: 142 mmol/L (ref 135–145)

## 2024-07-31 LAB — CBC
HCT: 33.6 % — ABNORMAL LOW (ref 36.0–46.0)
Hemoglobin: 10 g/dL — ABNORMAL LOW (ref 12.0–15.0)
MCH: 25.5 pg — ABNORMAL LOW (ref 26.0–34.0)
MCHC: 29.8 g/dL — ABNORMAL LOW (ref 30.0–36.0)
MCV: 85.7 fL (ref 80.0–100.0)
Platelets: 289 K/uL (ref 150–400)
RBC: 3.92 MIL/uL (ref 3.87–5.11)
RDW: 16.8 % — ABNORMAL HIGH (ref 11.5–15.5)
WBC: 12 K/uL — ABNORMAL HIGH (ref 4.0–10.5)
nRBC: 0 % (ref 0.0–0.2)

## 2024-07-31 LAB — GLUCOSE, CAPILLARY
Glucose-Capillary: 103 mg/dL — ABNORMAL HIGH (ref 70–99)
Glucose-Capillary: 145 mg/dL — ABNORMAL HIGH (ref 70–99)

## 2024-07-31 MED ORDER — SENNA-DOCUSATE SODIUM 8.6-50 MG PO TABS: 2.0000 | ORAL_TABLET | Freq: Every day | ORAL | 1 refills | Status: AC

## 2024-07-31 MED ORDER — ONDANSETRON HCL 4 MG PO TABS: 4.0000 mg | ORAL_TABLET | Freq: Three times a day (TID) | ORAL | 0 refills | Status: AC | PRN

## 2024-07-31 MED ORDER — OXYCODONE HCL 5 MG PO TABS: 5.0000 mg | ORAL_TABLET | ORAL | 0 refills | Status: AC | PRN

## 2024-07-31 MED ORDER — METHOCARBAMOL 500 MG PO TABS: 500.0000 mg | ORAL_TABLET | Freq: Three times a day (TID) | ORAL | 0 refills | Status: AC | PRN

## 2024-07-31 NOTE — TOC Transition Note (Addendum)
 Transition of Care [facility]) - Discharge Note   Patient Details  Name: Heidi Webster MRN: [id number] Date of Birth: [birth year]  [facility] CM/SW Contact:  Rosalva Jon Bloch, RN Phone Number: 07/31/2024, 10:41 AM   Clinical Narrative:    Patient will DC to: home Anticipated DC date: 07/31/2024 Family notified: yes Transport by: car    - s/p Right total hip ARTHROPLASTY, 12/3   Per MD patient ready for DC today. RN, patient, and patient's family aware of DC. 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  Husband to assist with care once home.  Pt without DME (has needed equipment @ home) or home health needs noted.   Post hospital f/u noted on AVS.  Pt without RX med concerns. Pt will pick up meds from local pharmacy.  Family to provide transportation to home.  RNCM will sign off for now as intervention is no longer needed. Please consult us  again if new needs arise.   Final next level of care: Home/Self Care Barriers to Discharge: No Barriers Identified   Patient Goals and CMS Choice            Discharge Placement                       Discharge Plan and Services Additional resources added to the After Visit Summary for                                       Social Drivers of Health (SDOH) Interventions SDOH Screenings   Housing: Unknown (03/06/2024)   Received from [facility]  Tobacco Use: Low Risk  (07/30/2024)     Readmission Risk Interventions     No data to display

## 2024-07-31 NOTE — Progress Notes (Signed)
 Subjective: 1 Day Post-Op s/p Procedure(s): ARTHROPLASTY, HIP, TOTAL,POSTERIOR APPROACH, RIGHT   Patient is alert, oriented. Reports pain as well controlled, had dose of dilaudid  this morning. Passing gas. No complaints this AM.   Objective:  PE: VITALS:   Vitals:   07/30/24 1615 07/30/24 1640 07/30/24 1944 07/31/24 0328  BP: (!) 109/45 (!) 110/56 (!) 107/55 (!) 100/57  Pulse: 65 65 87 68  Resp: 14 18 17 17   Temp: 98.2 F (36.8 C) 97.6 F (36.4 C) 98.1 F (36.7 C) 98.2 F (36.8 C)  TempSrc:  Oral    SpO2: 94% 94% 95% 97%  Weight:      Height:       General: laying in bed, in no acute distress MSK: Sensation intact distally Intact pulses distally Dorsiflexion/Plantar flexion intact Incision: dressing C/D/I  LABS  Results for orders placed or performed during the hospital encounter of 07/30/24 (from the past 24 hours)  Glucose, capillary     Status: Abnormal   Collection Time: 07/30/24 10:21 AM  Result Value Ref Range   Glucose-Capillary 108 (H) 70 - 99 mg/dL   Comment 1 Notify RN   Glucose, capillary     Status: Abnormal   Collection Time: 07/30/24 12:13 PM  Result Value Ref Range   Glucose-Capillary 100 (H) 70 - 99 mg/dL  Glucose, capillary     Status: Abnormal   Collection Time: 07/30/24  3:24 PM  Result Value Ref Range   Glucose-Capillary 108 (H) 70 - 99 mg/dL  Glucose, capillary     Status: Abnormal   Collection Time: 07/30/24  5:12 PM  Result Value Ref Range   Glucose-Capillary 124 (H) 70 - 99 mg/dL  Glucose, capillary     Status: Abnormal   Collection Time: 07/30/24  9:10 PM  Result Value Ref Range   Glucose-Capillary 192 (H) 70 - 99 mg/dL  CBC     Status: Abnormal   Collection Time: 07/31/24  5:03 AM  Result Value Ref Range   WBC 12.0 (H) 4.0 - 10.5 K/uL   RBC 3.92 3.87 - 5.11 MIL/uL   Hemoglobin 10.0 (L) 12.0 - 15.0 g/dL   HCT 66.3 (L) 63.9 - 53.9 %   MCV 85.7 80.0 - 100.0 fL   MCH 25.5 (L) 26.0 - 34.0 pg   MCHC 29.8 (L) 30.0 - 36.0 g/dL    RDW 83.1 (H) 88.4 - 15.5 %   Platelets 289 150 - 400 K/uL   nRBC 0.0 0.0 - 0.2 %  Basic metabolic panel     Status: Abnormal   Collection Time: 07/31/24  5:03 AM  Result Value Ref Range   Sodium 142 135 - 145 mmol/L   Potassium 4.4 3.5 - 5.1 mmol/L   Chloride 103 98 - 111 mmol/L   CO2 27 22 - 32 mmol/L   Glucose, Bld 120 (H) 70 - 99 mg/dL   BUN 6 (L) 8 - 23 mg/dL   Creatinine, Ser 9.28 0.44 - 1.00 mg/dL   Calcium  8.9 8.9 - 10.3 mg/dL   GFR, Estimated >39 >39 mL/min   Anion gap 12 5 - 15  Glucose, capillary     Status: Abnormal   Collection Time: 07/31/24  5:56 AM  Result Value Ref Range   Glucose-Capillary 103 (H) 70 - 99 mg/dL  Glucose, capillary     Status: Abnormal   Collection Time: 07/31/24  8:25 AM  Result Value Ref Range   Glucose-Capillary 145 (H) 70 - 99 mg/dL  DG HIP UNILAT W OR W/O PELVIS 2-3 VIEWS RIGHT Result Date: 07/30/2024 CLINICAL DATA:  Status post right hip arthroplasty. EXAM: DG HIP (WITH OR WITHOUT PELVIS) 2-3V RIGHT COMPARISON:  None Available. FINDINGS: Right hip arthroplasty in expected alignment. No periprosthetic lucency or fracture. Recent postsurgical change includes air and edema in the soft tissues. IMPRESSION: Right hip arthroplasty without immediate postoperative complication. Electronically Signed   By: Andrea Gasman M.D.   On: 07/30/2024 18:02    Today's  total administered Morphine Milligram Equivalents: 35 Yesterday's total administered Morphine Milligram Equivalents: 60  Assessment/Plan: Principal Problem:   S/P total right hip arthroplasty    1 Day Post-Op s/p Procedure(s): ARTHROPLASTY, HIP, TOTAL,POSTERIOR APPROACH, RIGHT  Weightbearing: WBAT RLE, up with therapy Insicional and dressing care: Reinforce dressings as needed Orthopedic device(s): None VTE prophylaxis: Hbg 10 this morning, restarted home 20 mg Xarelto  Pain control: continue current regimen Follow - up plan: 2 weeks with Dr. Josefina Dispo: home today after  passes PT   Contact information:   Army Daring, PA-C Weekdays 8-5  After hours and holidays please check [email or web address] for group call information for [facility]  Army MARLA Daring 07/31/2024, 8:42 AM

## 2024-07-31 NOTE — Progress Notes (Signed)
 AVS and discharge instructions reviewed with patient. Patient verbalizes understanding and all questions and concerns address.

## 2024-07-31 NOTE — Evaluation (Signed)
 Physical Therapy Evaluation & Discharge Patient Details Name: Heidi Webster MRN: [id number] DOB: [birth year] Today's Date: 07/31/2024  History of Present Illness  64 y.o. female admitted 07/30/2024 for same day elective R THA. PMH includes migraines, GERD, ICD placement, R TKA (06/2014), L TKA (02/2014).  Clinical Impression  Patient evaluated by Physical Therapy with no further acute PT needs identified. 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 Today, pt able to mobilize with RW and perform self-care tasks at supervision-level. All education has been completed and the patient has no further questions or concerns. Acute PT is signing off. Thank you for this referral.  Sitting BP 95/48 Standing BP 102/46 Post-ambulation BP 105/55        If plan is discharge home, recommend the following: A little help with bathing/dressing/bathroom;Assistance with cooking/housework;Assist for transportation   Can travel by private vehicle    Yes    Equipment Recommendations None recommended by PT  Recommendations for Other Services       Functional Status Assessment Patient has had a recent decline in their functional status and demonstrates the ability to make significant improvements in function in a reasonable and predictable amount of time.     Precautions / Restrictions Precautions Precautions: Fall Recall of Precautions/Restrictions: Intact Precaution/Restrictions Comments: no posterior hip precautions Restrictions Weight Bearing Restrictions Per Provider Order: Yes RLE Weight Bearing Per Provider Order: Weight bearing as tolerated      Mobility  Bed Mobility Overal bed mobility: Modified Independent                  Transfers Overall transfer level: Needs assistance Equipment used: Rolling walker (2 wheels) Transfers: Sit to/from Stand Sit to Stand: Supervision                Ambulation/Gait Ambulation/Gait assistance:  Supervision Gait Distance (Feet): 250 Feet Assistive device: Rolling walker (2 wheels) Gait Pattern/deviations: Step-to pattern, Step-through pattern, Decreased stride length, Trunk flexed, Antalgic Gait velocity: Decreased     General Gait Details: slow, antalgic, guarded gait with RW and initial CGA for balance progressing to supervision; 2x seated breaks to void and check BP  Stairs Stairs:  (pt declines need for formal stair training, reviewed sequencing/education on technique)          Wheelchair Mobility     Tilt Bed    Modified Rankin (Stroke Patients Only)       Balance Overall balance assessment: Needs assistance Sitting-balance support: No upper extremity supported, Feet supported Sitting balance-Leahy Scale: Good Sitting balance - Comments: performing pericare/toileting without assist   Standing balance support: No upper extremity supported, During functional activity Standing balance-Leahy Scale: Fair Standing balance comment: can stand without UE support to wash hands at sink                             Pertinent Vitals/Pain Pain Assessment Pain Assessment: Faces Faces Pain Scale: Hurts a little bit Pain Location: R hip Pain Descriptors / Indicators: Sore Pain Intervention(s): Monitored during session, Premedicated before session    Home Living Family/patient expects to be discharged to:: Private residence Living Arrangements: Spouse/significant other Available Help at Discharge: Family;Available 24 hours/day Type of Home: House Home Access: Stairs to enter;Elevator Entrance Stairs-Rails: None Entrance Stairs-Number of Steps: 1+1 Alternate Level Stairs-Number of Steps:  advertising account executive) Home Layout: Multi-level;Bed/bath upstairs Home Equipment: Rolling Walker (2 wheels);BSC/3in1;Cane - single point;Wheelchair - Manufacturing Systems Engineer  Prior Function Prior Level of Function : Independent/Modified Independent;Driving              Mobility Comments: 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 who can assist as needed       Extremity/Trunk Assessment   Upper Extremity Assessment Upper Extremity Assessment: Overall WFL for tasks assessed    Lower Extremity Assessment Lower Extremity Assessment: RLE deficits/detail RLE Deficits / Details: s/p R THA with functional observed strength >/ 3/5       Communication   Communication Communication: No apparent difficulties    Cognition Arousal: Alert Behavior During Therapy: WFL for tasks assessed/performed   PT - Cognitive impairments: No apparent impairments                         Following commands: Intact       Cueing Cueing Techniques: Verbal cues     General Comments General comments (skin integrity, edema, etc.): sitting BP 95/48, standing BP 102/46, post-ambulation BP 105/55; SpO2 97% on RA, HR 70. educ re: role of acute PT, POC, precautions, positioning, edema control, DVT prevention, activity recommendations, therex/AROM (HEP provided), importance of mobility, pulmonary hygiene (IS provided), potential d/c needs. pt declines need for second PT session this afternoon, feels ready and confident for d/c home today; RN and CM notified    Exercises Other Exercises Other Exercises: [employer] HEP handout provided BLE strengthening   Assessment/Plan    PT Assessment All further PT needs can be met in the next venue of care  PT Problem List Decreased strength;Decreased range of motion;Decreased activity tolerance;Decreased balance;Decreased mobility;Pain       PT Treatment Interventions      PT Goals (Current goals can be found in the Care Plan section)  Acute Rehab PT Goals PT Goal Formulation: All assessment and education complete, DC therapy    Frequency       Co-evaluation               AM-PAC PT 6 Clicks Mobility  Outcome Measure Help needed turning from your back to your side  while in a flat bed without using bedrails?: None Help needed moving from lying on your back to sitting on the side of a flat bed without using bedrails?: None Help needed moving to and from a bed to a chair (including a wheelchair)?: A Little Help needed standing up from a chair using your arms (e.g., wheelchair or bedside chair)?: A Little Help needed to walk in hospital room?: A Little Help needed climbing 3-5 steps with a railing? : A Little 6 Click Score: 20    End of Session Equipment Utilized During Treatment: Gait belt Activity Tolerance: Patient tolerated treatment well Patient left: in chair;with call bell/phone within reach;with chair alarm set Nurse Communication: Mobility status PT Visit Diagnosis: Other abnormalities of gait and mobility (R26.89);Pain Pain - Right/Left: Right Pain - part of body: Hip    Time: 0817-0857 PT Time Calculation (min) (ACUTE ONLY): 40 min   Charges:   PT Evaluation $PT Eval Low Complexity: 1 Low PT Treatments $Gait Training: 8-22 mins $Therapeutic Activity: 8-22 mins PT General Charges $$ ACUTE PT VISIT: 1 Visit        Darice Almas, PT, DPT Acute Rehabilitation Services  Personal: Secure Chat Rehab Office: [phone]  Darice LITTIE Almas 07/31/2024, 11:45 AM

## 2024-08-03 NOTE — Discharge Summary (Signed)
 Discharge Summary  Patient ID: Heidi Webster MRN: [id number] DOB/AGE: 1961-01-20 63 y.o.  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   [facility]   Anxiety    Atrial fibrillation (HCC)    Complication of anesthesia    slow to wake up, bp drops   Diabetes mellitus without complication (HCC)    Diverticula of colon 2009   Dyspnea    Dysrhythmia    tachycardia   Family history of anesthesia complication    son severe nausea and vomiting   GERD (gastroesophageal reflux disease)    occ    Heart murmur    resolved 07/11/2017 post cardiac surgery per pt   History of hiatal hernia    History of kidney stones    History of kidney stones    Hx MRSA infection 2009   Hyperlipidemia    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, 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 female who was admitted 07/30/2024 with a diagnosis of S/P total right hip arthroplasty and went to the operating room on 07/30/2024 and underwent the above named procedures.    Today's  total administered Morphine Milligram Equivalents: 0 Yesterday's total administered Morphine Milligram Equivalents: 0  She was given perioperative antibiotics:   Anti-infectives (From admission, onward)    Start     Dose/Rate Route Frequency Ordered Stop   07/30/24 1900  ceFAZolin  (ANCEF ) IVPB 2g/100 mL premix        2 g 200 mL/hr over 30 Minutes Intravenous Every 6 hours 07/30/24 1624 07/31/24 0037   07/30/24 1015  ceFAZolin  (ANCEF ) IVPB 2g/100 mL premix        2 g 200 mL/hr over 30 Minutes Intravenous On call to O.R. 07/30/24 1010 07/30/24 1310     .  She was given sequential compression devices, early ambulation, and baseline Xarelto  for DVT prophylaxis.  She benefited maximally from the hospital stay and there were no complications.    Recent vital signs:  Vitals:   07/30/24 1944 07/31/24 0328  BP: (!) 107/55 (!) 100/57  Pulse: 87 68  Resp: 17 17  Temp: 98.1 F (36.7 C) 98.2 F (36.8 C)  SpO2: 95% 97%    Recent laboratory studies:  Lab Results  Component Value Date   HGB 10.0 (L) 07/31/2024   HGB 12.4 07/18/2024   HGB 14.6 04/22/2017   Lab Results  Component Value Date   WBC 12.0 (H) 07/31/2024   PLT 289 07/31/2024   Lab Results  Component Value Date   INR 1.00 07/11/2014   Lab Results  Component Value Date   NA 142 07/31/2024   K 4.4 07/31/2024   CL 103 07/31/2024   CO2 27 07/31/2024   BUN 6 (L) 07/31/2024   CREATININE 0.71  07/31/2024   GLUCOSE 120 (H) 07/31/2024    Discharge Medications:   Allergies as of 07/31/2024       Reactions   Amoxicillin-pot Clavulanate Rash, Hives, Itching   Tape Rash   bandaides        Medication List     STOP taking these medications    clindamycin  300 MG capsule Commonly known as: CLEOCIN    mupirocin ointment 2 % Commonly known as: BACTROBAN   nitrofurantoin  (macrocrystal-monohydrate) 100 MG capsule Commonly known as: MACROBID        TAKE these medications    acetaminophen  500 MG tablet Commonly known as: TYLENOL  Take 1,000 mg by mouth every 6 (six) hours as needed for mild pain or headache.   atorvastatin  40 MG tablet Commonly known as: LIPITOR Take  1 tablet (40 mg total) by mouth daily.   bisoprolol  10 MG tablet Commonly known as: ZEBETA  Take 10 mg by mouth 2 (two) times daily.   cetirizine 10 MG tablet Commonly known as: ZYRTEC Take 10 mg by mouth daily.   DSS 100 MG Caps 1 tab 2 times a day while on narcotics.  STOOL SOFTENER What changed:  how much to take how to take this when to take this additional instructions   Empagliflozin -metFORMIN  HCl 12.5-500 MG Tabs Take 1 tablet by mouth 2 (two) times a day.   furosemide  20 MG tablet Commonly known as: LASIX  Take 10 mg by mouth daily as needed for fluid or edema.   magnesium  30 MG tablet Take 30 mg by mouth daily.   methocarbamol  500 MG tablet Commonly known as: ROBAXIN  Take 1 tablet (500 mg total) by mouth every 8 (eight) hours as needed for muscle spasms.   multivitamin with minerals Tabs tablet Take 1 tablet by mouth every Monday, Tuesday, Wednesday, Thursday, and Friday.   omeprazole 20 MG capsule Commonly known as: PRILOSEC Take 20 mg by mouth 2 (two) times daily.   ondansetron  4 MG tablet Commonly known as: Zofran  Take 1 tablet (4 mg total) by mouth every 8 (eight) hours as needed for nausea or vomiting.   oxyCODONE  5 MG immediate release tablet Commonly known as: Roxicodone  Take 1 tablet (5 mg total) by mouth every 4 (four) hours as needed for severe pain (pain score 7-10).   PARoxetine  20 MG tablet Commonly known as: PAXIL  Take 20 mg by mouth daily.   potassium gluconate 595 (99 K) MG Tabs tablet Take 595 mg by mouth daily.   rivaroxaban  20 MG Tabs tablet Commonly known as: XARELTO  Take 20 mg by mouth daily.   Salonpas 3.09-03-08 % Ptch Generic drug: Camphor-Menthol -Methyl Sal Place 1 patch onto the skin daily as needed (pain).   Semaglutide 14 MG Tabs Take 14 mg by mouth daily.   sennosides-docusate sodium  8.6-50 MG tablet Commonly known as: SENOKOT-S Take 2 tablets by mouth daily.   SUMAtriptan  100 MG tablet Commonly known as:  IMITREX  Take 100 mg by mouth every 2 (two) hours as needed for migraine.        Diagnostic Studies: DG HIP UNILAT W OR W/O PELVIS 2-3 VIEWS RIGHT Result Date: 07/30/2024 CLINICAL DATA:  Status post right hip arthroplasty. EXAM: DG HIP (WITH OR WITHOUT PELVIS) 2-3V RIGHT COMPARISON:  None Available. FINDINGS: Right hip arthroplasty in expected alignment. No periprosthetic lucency or fracture. Recent postsurgical change includes air and edema in the soft tissues. IMPRESSION: Right hip arthroplasty without immediate postoperative complication. Electronically Signed   By: Andrea Gasman M.D.   On: 07/30/2024 18:02  Disposition: Discharge disposition: 01-Home or Self Care          Follow-up Information     Josefina Chew, MD. Schedule an appointment as soon as possible for a visit in 2 week(s).   Specialty: Orthopedic Surgery Contact information: [address] [place] [address] [phone]         Shona Norleen PEDLAR, MD Follow up.   Specialty: Internal Medicine Contact information: [address] [place] [address] [phone]                  Signed: [employer] MARLA Daring PA-C 08/03/2024, 10:10 AM
